# Patient Record
Sex: Female | Born: 1970
Health system: Southern US, Community
[De-identification: ages and names within clinical notes are randomized; demographics above are authoritative.]

## PROBLEM LIST (undated history)

## (undated) ENCOUNTER — Ambulatory Visit (HOSPITAL_COMMUNITY): Admission: EM | Payer: Federal, State, Local not specified - PPO | Source: Home / Self Care

---

## 2002-02-14 ENCOUNTER — Inpatient Hospital Stay (HOSPITAL_COMMUNITY): Admission: AD | Admit: 2002-02-14 | Discharge: 2002-02-14 | Payer: Self-pay | Admitting: *Deleted

## 2002-02-18 ENCOUNTER — Inpatient Hospital Stay (HOSPITAL_COMMUNITY): Admission: AD | Admit: 2002-02-18 | Discharge: 2002-02-20 | Payer: Self-pay | Admitting: Obstetrics

## 2003-02-09 ENCOUNTER — Ambulatory Visit (HOSPITAL_COMMUNITY): Admission: RE | Admit: 2003-02-09 | Discharge: 2003-02-09 | Payer: Self-pay | Admitting: *Deleted

## 2003-02-25 ENCOUNTER — Ambulatory Visit (HOSPITAL_COMMUNITY): Admission: RE | Admit: 2003-02-25 | Discharge: 2003-02-25 | Payer: Self-pay | Admitting: *Deleted

## 2003-03-06 ENCOUNTER — Ambulatory Visit (HOSPITAL_COMMUNITY): Admission: RE | Admit: 2003-03-06 | Discharge: 2003-03-06 | Payer: Self-pay | Admitting: *Deleted

## 2003-12-23 ENCOUNTER — Inpatient Hospital Stay (HOSPITAL_COMMUNITY): Admission: AD | Admit: 2003-12-23 | Discharge: 2003-12-24 | Payer: Self-pay | Admitting: Obstetrics

## 2004-07-09 ENCOUNTER — Inpatient Hospital Stay (HOSPITAL_COMMUNITY): Admission: AD | Admit: 2004-07-09 | Discharge: 2004-07-09 | Payer: Self-pay | Admitting: Obstetrics

## 2004-07-10 ENCOUNTER — Inpatient Hospital Stay (HOSPITAL_COMMUNITY): Admission: AD | Admit: 2004-07-10 | Discharge: 2004-07-12 | Payer: Self-pay | Admitting: Obstetrics

## 2009-02-13 ENCOUNTER — Ambulatory Visit: Payer: Self-pay | Admitting: Physician Assistant

## 2009-02-13 ENCOUNTER — Inpatient Hospital Stay (HOSPITAL_COMMUNITY): Admission: AD | Admit: 2009-02-13 | Discharge: 2009-02-13 | Payer: Self-pay | Admitting: Obstetrics

## 2009-03-05 ENCOUNTER — Ambulatory Visit (HOSPITAL_COMMUNITY): Admission: RE | Admit: 2009-03-05 | Discharge: 2009-03-05 | Payer: Self-pay | Admitting: Obstetrics

## 2009-05-27 ENCOUNTER — Ambulatory Visit (HOSPITAL_COMMUNITY): Admission: RE | Admit: 2009-05-27 | Discharge: 2009-05-27 | Payer: Self-pay | Admitting: Obstetrics

## 2009-07-16 ENCOUNTER — Inpatient Hospital Stay (HOSPITAL_COMMUNITY): Admission: AD | Admit: 2009-07-16 | Discharge: 2009-07-16 | Payer: Self-pay | Admitting: Obstetrics

## 2009-07-28 ENCOUNTER — Encounter (INDEPENDENT_AMBULATORY_CARE_PROVIDER_SITE_OTHER): Payer: Self-pay | Admitting: Obstetrics

## 2009-07-28 ENCOUNTER — Inpatient Hospital Stay (HOSPITAL_COMMUNITY): Admission: RE | Admit: 2009-07-28 | Discharge: 2009-07-30 | Payer: Self-pay | Admitting: Obstetrics

## 2009-07-31 ENCOUNTER — Inpatient Hospital Stay (HOSPITAL_COMMUNITY): Admission: AD | Admit: 2009-07-31 | Discharge: 2009-07-31 | Payer: Self-pay | Admitting: Obstetrics

## 2010-04-17 ENCOUNTER — Encounter: Payer: Self-pay | Admitting: Obstetrics

## 2010-06-14 LAB — CBC
HCT: 25.9 % — ABNORMAL LOW (ref 36.0–46.0)
HCT: 40.7 % (ref 36.0–46.0)
Hemoglobin: 14 g/dL (ref 12.0–15.0)
Hemoglobin: 8.9 g/dL — ABNORMAL LOW (ref 12.0–15.0)
MCHC: 34.3 g/dL (ref 30.0–36.0)
MCHC: 34.6 g/dL (ref 30.0–36.0)
MCV: 86.4 fL (ref 78.0–100.0)
MCV: 87.5 fL (ref 78.0–100.0)
Platelets: 101 10*3/uL — ABNORMAL LOW (ref 150–400)
Platelets: 122 10*3/uL — ABNORMAL LOW (ref 150–400)
RBC: 2.96 MIL/uL — ABNORMAL LOW (ref 3.87–5.11)
RBC: 4.71 MIL/uL (ref 3.87–5.11)
RDW: 19.1 % — ABNORMAL HIGH (ref 11.5–15.5)
RDW: 19.5 % — ABNORMAL HIGH (ref 11.5–15.5)
WBC: 30.5 10*3/uL — ABNORMAL HIGH (ref 4.0–10.5)
WBC: 6 10*3/uL (ref 4.0–10.5)

## 2010-06-14 LAB — COMPREHENSIVE METABOLIC PANEL
ALT: 15 U/L (ref 0–35)
AST: 28 U/L (ref 0–37)
Albumin: 2.8 g/dL — ABNORMAL LOW (ref 3.5–5.2)
Alkaline Phosphatase: 248 U/L — ABNORMAL HIGH (ref 39–117)
BUN: 4 mg/dL — ABNORMAL LOW (ref 6–23)
CO2: 21 mEq/L (ref 19–32)
Calcium: 8.6 mg/dL (ref 8.4–10.5)
Chloride: 106 mEq/L (ref 96–112)
Creatinine, Ser: 0.74 mg/dL (ref 0.4–1.2)
GFR calc Af Amer: >60 mL/min (ref >60)
GFR calc non Af Amer: >60 mL/min (ref >60)
Glucose, Bld: 70 mg/dL (ref 70–99)
Potassium: 3.6 mEq/L (ref 3.5–5.1)
Sodium: 136 mEq/L (ref 135–145)
Total Bilirubin: 0.5 mg/dL (ref 0.3–1.2)
Total Protein: 6.3 g/dL (ref 6.0–8.3)

## 2010-06-14 LAB — STREP B DNA PROBE: Strep Group B Ag: NEGATIVE

## 2010-06-14 LAB — RPR: RPR Ser Ql: NONREACTIVE

## 2010-06-29 LAB — URINALYSIS, ROUTINE W REFLEX MICROSCOPIC
Bilirubin Urine: NEGATIVE
Glucose, UA: NEGATIVE mg/dL
Hgb urine dipstick: NEGATIVE
Ketones, ur: NEGATIVE mg/dL
Nitrite: NEGATIVE
Protein, ur: NEGATIVE mg/dL
Specific Gravity, Urine: 1.015 (ref 1.005–1.030)
Urobilinogen, UA: 0.2 mg/dL (ref 0.0–1.0)
pH: 7.5 (ref 5.0–8.0)

## 2010-08-12 NOTE — Op Note (Signed)
NAMEMALAIYA, PACZKOWSKI                           ACCOUNT NO.:  [id number]   MEDICAL RECORD NO.:  [id number]                   PATIENT TYPE:  AMB   LOCATION:  ENDO                                 FACILITY:  [facility]   PHYSICIAN:  Georgiana Spinner, M.D.                 DATE OF BIRTH:  05-Jun-1970   DATE OF PROCEDURE:  02/25/2003  DATE OF DISCHARGE:                                 OPERATIVE REPORT   PROCEDURE:  Upper endoscopy.   INDICATIONS:  Abdominal pain.   ANESTHESIA:  Demerol 60 mg, Versed 6 mg.   DESCRIPTION OF PROCEDURE:  With the patient mildly sedated in the left  lateral decubitus position, the Olympus videoscopic endoscope was inserted  in the mouth, passed under direct vision through the esophagus, which  appeared normal, into the stomach.  The fundus, body, antrum, duodenal bulb,  second portion of the duodenum all appeared normal.  From this point the  endoscope was slowly withdrawn, taking circumferential views of the duodenal  mucosa until the endoscope had been pulled back into the stomach, placed in  retroflexion to view the stomach from below.  The endoscope was straightened  and withdrawn taking circumferential views of the remaining gastric and  esophageal mucosa.  The patient's vital signs and pulse oximetry remained  stable.  The patient tolerated the procedure well and without apparent  complications.   FINDINGS:  Unremarkable examination.   PLAN:  Will discuss with husband and have the patient follow up with me as  an outpatient to review scans that have been done and/or ordered.                                               Georgiana Spinner, M.D.    GMO/MEDQ  D:  02/25/2003  T:  02/25/2003  Job:  [id number]   cc:   Dr. Lance Bosch, [facility]

## 2010-10-12 ENCOUNTER — Other Ambulatory Visit (HOSPITAL_COMMUNITY): Payer: Self-pay | Admitting: Obstetrics

## 2010-10-12 DIAGNOSIS — Z1231 Encounter for screening mammogram for malignant neoplasm of breast: Secondary | ICD-10-CM

## 2010-10-21 ENCOUNTER — Ambulatory Visit (HOSPITAL_COMMUNITY)
Admission: RE | Admit: 2010-10-21 | Discharge: 2010-10-21 | Disposition: A | Discharge status: Home / Self Care | Payer: Federal, State, Local not specified - PPO | Source: Ambulatory Visit | Attending: Obstetrics | Admitting: Obstetrics

## 2010-10-21 DIAGNOSIS — Z1231 Encounter for screening mammogram for malignant neoplasm of breast: Secondary | ICD-10-CM | POA: Insufficient documentation

## 2011-05-04 ENCOUNTER — Encounter (HOSPITAL_COMMUNITY): Payer: Self-pay | Admitting: Emergency Medicine

## 2011-05-04 ENCOUNTER — Emergency Department (HOSPITAL_COMMUNITY)
Admission: EM | Admit: 2011-05-04 | Discharge: 2011-05-05 | Disposition: A | Discharge status: Home / Self Care | Payer: Federal, State, Local not specified - PPO | Attending: Emergency Medicine | Admitting: Emergency Medicine

## 2011-05-04 ENCOUNTER — Emergency Department (HOSPITAL_COMMUNITY)
Admission: EM | Admit: 2011-05-04 | Discharge: 2011-05-04 | Disposition: A | Discharge status: Home / Self Care | Payer: Federal, State, Local not specified - PPO | Attending: Emergency Medicine | Admitting: Emergency Medicine

## 2011-05-04 DIAGNOSIS — K0889 Other specified disorders of teeth and supporting structures: Secondary | ICD-10-CM

## 2011-05-04 DIAGNOSIS — K089 Disorder of teeth and supporting structures, unspecified: Secondary | ICD-10-CM | POA: Insufficient documentation

## 2011-05-04 DIAGNOSIS — K029 Dental caries, unspecified: Secondary | ICD-10-CM | POA: Insufficient documentation

## 2011-05-04 DIAGNOSIS — Z79899 Other long term (current) drug therapy: Secondary | ICD-10-CM | POA: Insufficient documentation

## 2011-05-04 MED ORDER — OXYCODONE-ACETAMINOPHEN 5-325 MG PO TABS: 1.0000 | ORAL_TABLET | ORAL | Status: AC | PRN

## 2011-05-04 MED ORDER — BUPIVACAINE HCL (PF) 0.5 % IJ SOLN
10.0000 mL | Freq: Once | INTRAMUSCULAR | Status: AC
  Administered 2011-05-05: 10 mL
  Filled 2011-05-04: qty 30

## 2011-05-04 MED ORDER — HYDROMORPHONE HCL PF 1 MG/ML IJ SOLN
1.0000 mg | Freq: Once | INTRAMUSCULAR | Status: AC
Start: 2011-05-04 — End: 2011-05-04
  Administered 2011-05-04: 1 mg via INTRAMUSCULAR
  Filled 2011-05-04: qty 1

## 2011-05-04 MED ORDER — HYDROMORPHONE HCL PF 1 MG/ML IJ SOLN
1.0000 mg | Freq: Once | INTRAMUSCULAR | Status: AC
  Administered 2011-05-04: 1 mg via INTRAMUSCULAR
  Filled 2011-05-04: qty 1

## 2011-05-04 NOTE — ED Notes (Signed)
Pt alert, c/o dental pain, seen in ED yesterday, states no relief, has f/u tomorrow

## 2011-05-04 NOTE — ED Provider Notes (Signed)
History     CSN: [id number]  Arrival date & time 05/04/11  2129   None     Chief Complaint  Patient presents with  . Dental Pain    (Consider location/radiation/quality/duration/timing/severity/associated sxs/prior treatment) HPI  Patient presents to emergency department complaining of left upper dental pain. Patient had dental extraction by her dentist yesterday and presented to the emergency department at 0300 this morning for evaluation of "severe dental pain." Patient was given narcotics in the ER with relief of pain and sent home with prescription of pain medication to followup with her dentist tomorrow morning at 10:30 AM. Per the patient and her husband, the patient has been taking at home by mouth narcotics without relief of pain. He denies fevers, chills, difficulty swallowing, difficulty breathing, facial swelling, earache, or headache. Patient states ongoing constant pain at site of tooth extraction. Patient states pain is aggravated by chewing and cold air. Denies alleviating factors.  History reviewed. No pertinent past medical history.  History reviewed. No pertinent past surgical history.  No family history on file.  History  Substance Use Topics  . Smoking status: Never Smoker   . Smokeless tobacco: Not on file  . Alcohol Use: No    OB History    Grav Para Term Preterm Abortions TAB SAB Ect Mult Living                  Review of Systems  All other systems reviewed and are negative.    Allergies  Review of patient's allergies indicates no known allergies.  Home Medications   Current Outpatient Rx  Name Route Sig Dispense Refill  . AMOXICILLIN 500 MG PO CAPS Oral Take 500 mg by mouth 3 (three) times daily.    Marland Kitchen HYDROCODONE-ACETAMINOPHEN 7.5-325 MG PO TABS Oral Take 1 tablet by mouth every 6 (six) hours as needed. Dose is 7.5-500mg     . OXYCODONE-ACETAMINOPHEN 5-325 MG PO TABS Oral Take 1 tablet by mouth every 4 (four) hours as needed for pain. 30  tablet 0    BP 129/78  Pulse 98  Temp(Src) 98.5 F (36.9 C) (Oral)  Resp 16  Wt 150 lb (68.04 kg)  SpO2 97%  Physical Exam  Vitals reviewed. Constitutional: She is oriented to person, place, and time. She appears well-developed and well-nourished. No distress.  HENT:  Head: Normocephalic and atraumatic.  Right Ear: External ear normal.  Left Ear: External ear normal.  Nose: Nose normal.  Mouth/Throat: No oropharyngeal exudate.       Massive decay of left upper first molar with dental extraction of second bicuspid. No gingival fluctuance of swelling. Patent airway.   Eyes: Conjunctivae and EOM are normal. Pupils are equal, round, and reactive to light.  Neck: Normal range of motion. Neck supple.  Cardiovascular: Normal rate, regular rhythm and normal heart sounds.  Exam reveals no gallop and no friction rub.   No murmur heard. Pulmonary/Chest: Effort normal and breath sounds normal. No respiratory distress. She has no wheezes. She has no rales. She exhibits no tenderness.  Abdominal: Soft. She exhibits no distension and no mass. There is no tenderness. There is no rebound and no guarding.  Lymphadenopathy:    She has no cervical adenopathy.  Neurological: She is alert and oriented to person, place, and time. She has normal reflexes.  Skin: Skin is warm and dry. No rash noted. She is not diaphoretic.  Psychiatric: She has a normal mood and affect.    ED Course  Dental Date/Time:  05/05/2011 12:11 AM Performed by: Jenness Corner Authorized by: Jenness Corner Consent: Verbal consent obtained. Consent given by: patient Patient understanding: patient states understanding of the procedure being performed Patient consent: the patient's understanding of the procedure matches consent given Patient identity confirmed: verbally with patient Local anesthesia used: yes Anesthesia: nerve block Local anesthetic: bupivacaine 0.5% without epinephrine Anesthetic total: 5 ml Patient  tolerance: Patient tolerated the procedure well with no immediate complications.    IM dilaudid.   Labs Reviewed - No data to display No results found.   No diagnosis found.    MDM  Afebrile, non toxic-appearing with a patent airway and no signs or symptoms of dental abscess. Mild gingival erythema but no fluctuant mass. Patient has close followup with dentist tomorrow morning at 10:30. No facial swelling.        Jenness Corner, [place] 05/05/11 0013  Medical screening examination/treatment/procedure(s) were performed by non-physician practitioner and as supervising physician I was immediately available for consultation/collaboration.  Sunnie Nielsen, MD 05/05/11 [phone]

## 2011-05-04 NOTE — ED Provider Notes (Signed)
Medical screening examination/treatment/procedure(s) were performed by non-physician practitioner and as supervising physician I was immediately available for consultation/collaboration.   Raeford Razor, MD 05/04/11 [id number]

## 2011-05-04 NOTE — ED Provider Notes (Signed)
History     CSN: [id number]  Arrival date & time 05/04/11  0331   First MD Initiated Contact with Patient 05/04/11 [phone]      Chief Complaint  Patient presents with  . Dental Pain    (Consider location/radiation/quality/duration/timing/severity/associated sxs/prior treatment) HPI Comments: Patient here with husband, she reports that she had a tooth pulled yesterday at 1500 - states initially the pain medication she was given was helping but reports now is not - denies drainage from the gum, no fever or chills.  Patient is a 41 y.o. female presenting with tooth pain. The history is provided by the patient and the spouse. No language interpreter was used.  Dental PainThe primary symptoms include mouth pain. Primary symptoms do not include dental injury, oral bleeding, oral lesions, headaches, fever, shortness of breath, sore throat, angioedema or cough. The symptoms began 2 to 6 hours ago. The symptoms are worsening. The symptoms are new. The symptoms occur constantly.  Additional symptoms include: dental sensitivity to temperature, gum tenderness, jaw pain and ear pain. Additional symptoms do not include: gum swelling, purulent gums, trismus, facial swelling, trouble swallowing, pain with swallowing, excessive salivation, dry mouth, taste disturbance, smell disturbance, drooling, hearing loss, nosebleeds, swollen glands, goiter and fatigue.    History reviewed. No pertinent past medical history.  History reviewed. No pertinent past surgical history.  History reviewed. No pertinent family history.  History  Substance Use Topics  . Smoking status: Never Smoker   . Smokeless tobacco: Not on file  . Alcohol Use: No    OB History    Grav Para Term Preterm Abortions TAB SAB Ect Mult Living                  Review of Systems  Constitutional: Negative for fever and fatigue.  HENT: Positive for ear pain. Negative for hearing loss, nosebleeds, sore throat, facial swelling, drooling and  trouble swallowing.   Respiratory: Negative for cough and shortness of breath.   Neurological: Negative for headaches.  All other systems reviewed and are negative.    Allergies  Review of patient's allergies indicates no known allergies.  Home Medications   Current Outpatient Rx  Name Route Sig Dispense Refill  . AMOXICILLIN 500 MG PO CAPS Oral Take 500 mg by mouth 3 (three) times daily.    Marland Kitchen HYDROCODONE-ACETAMINOPHEN 7.5-325 MG PO TABS Oral Take 1 tablet by mouth every 6 (six) hours as needed. Dose is 7.5-500mg       BP 126/54  Pulse 96  Temp(Src) 98.3 F (36.8 C) (Oral)  Resp 16  SpO2 100%  Physical Exam  Nursing note and vitals reviewed. Constitutional: She is oriented to person, place, and time. She appears well-developed and well-nourished. No distress.  HENT:  Head: Normocephalic and atraumatic.  Right Ear: External ear normal.  Left Ear: External ear normal.  Nose: Nose normal.  Mouth/Throat: Oropharynx is clear and moist. Abnormal dentition. No dental abscesses. No oropharyngeal exudate.    Eyes: Conjunctivae are normal. Pupils are equal, round, and reactive to light. No scleral icterus.  Neck: Normal range of motion. Neck supple.  Cardiovascular: Normal rate, regular rhythm and normal heart sounds.  Exam reveals no gallop and no friction rub.   No murmur heard. Pulmonary/Chest: Effort normal and breath sounds normal. She exhibits no tenderness.  Abdominal: Soft. Bowel sounds are normal. She exhibits no distension. There is no tenderness.  Musculoskeletal: Normal range of motion. She exhibits no edema and no tenderness.  Lymphadenopathy:  She has no cervical adenopathy.  Neurological: She is alert and oriented to person, place, and time. No cranial nerve deficit.  Skin: Skin is warm and dry. No rash noted. No erythema. No pallor.  Psychiatric: She has a normal mood and affect. Her behavior is normal. Judgment and thought content normal.    ED Course    Procedures (including critical care time)  Labs Reviewed - No data to display No results found.   Dental pain s/p tooth extraction    MDM  No evidence for infection - will give better pain control - patient to follow up with her dentist tomorrow.      Feels better after pain control - will discharge home.  Izola Price [place], [place] 05/04/11 [phone]

## 2011-05-04 NOTE — ED Notes (Addendum)
Pt had a tooth pulled yesterday around 3pm and is c/o pain at the site  Pt was given antibiotic and pain medication by her dentist

## 2011-05-04 NOTE — ED Notes (Signed)
Pt provided ice pack 

## 2011-09-19 ENCOUNTER — Other Ambulatory Visit (HOSPITAL_COMMUNITY): Payer: Self-pay | Admitting: Obstetrics

## 2011-09-19 DIAGNOSIS — Z1231 Encounter for screening mammogram for malignant neoplasm of breast: Secondary | ICD-10-CM

## 2011-10-04 ENCOUNTER — Ambulatory Visit (INDEPENDENT_AMBULATORY_CARE_PROVIDER_SITE_OTHER): Payer: Federal, State, Local not specified - PPO | Admitting: Family Medicine

## 2011-10-04 ENCOUNTER — Ambulatory Visit: Payer: Federal, State, Local not specified - PPO

## 2011-10-04 VITALS — BP 98/70 | HR 91 | Temp 98.1°F | Resp 18 | Ht 64.5 in | Wt 142.0 lb

## 2011-10-04 DIAGNOSIS — R109 Unspecified abdominal pain: Secondary | ICD-10-CM

## 2011-10-04 DIAGNOSIS — R110 Nausea: Secondary | ICD-10-CM

## 2011-10-04 LAB — POCT UA - MICROSCOPIC ONLY
Bacteria, U Microscopic: NEGATIVE
Casts, Ur, LPF, POC: NEGATIVE
Crystals, Ur, HPF, POC: NEGATIVE
Yeast, UA: NEGATIVE

## 2011-10-04 LAB — POCT CBC
Granulocyte percent: 65.9 %G (ref 37–80)
HCT, POC: 47.2 % (ref 37.7–47.9)
Hemoglobin: 15.3 g/dL (ref 12.2–16.2)
Lymph, poc: 2.6 (ref 0.6–3.4)
MCH, POC: 29.9 pg (ref 27–31.2)
MCHC: 32.4 g/dL (ref 31.8–35.4)
MCV: 92.2 fL (ref 80–97)
MID (cbc): 0.8 (ref 0–0.9)
MPV: 9.9 fL (ref 0–99.8)
POC Granulocyte: 6.7 (ref 2–6.9)
POC LYMPH PERCENT: 25.8 %L (ref 10–50)
POC MID %: 8.3 %M (ref 0–12)
Platelet Count, POC: 286 10*3/uL (ref 142–424)
RBC: 5.12 M/uL (ref 4.04–5.48)
RDW, POC: 12.6 %
WBC: 10.2 10*3/uL (ref 4.6–10.2)

## 2011-10-04 LAB — POCT URINE PREGNANCY: Preg Test, Ur: NEGATIVE

## 2011-10-04 MED ORDER — ONDANSETRON 8 MG PO TBDP: 8.0000 mg | ORAL_TABLET | Freq: Three times a day (TID) | ORAL | Status: AC | PRN

## 2011-10-04 MED ORDER — DICYCLOMINE HCL 10 MG PO CAPS: 10.0000 mg | ORAL_CAPSULE | Freq: Three times a day (TID) | ORAL | Status: DC

## 2011-10-04 MED ORDER — ONDANSETRON 4 MG PO TBDP
4.0000 mg | ORAL_TABLET | Freq: Once | ORAL | Status: AC
  Administered 2011-10-04: 4 mg via ORAL

## 2011-10-04 NOTE — Progress Notes (Signed)
[name] 41 year old Arabic woman who comes in with her husband. Woman developed nausea vomiting and crampy bowel pain at 7 AM this morning with a rather acute onset. The cramps persisted all day with 3 episodes of vomiting. She's also had some diarrhea but is been no blood in the stool.  Patient has no history of abdominal problems. She denies any urinary symptoms, fever, hematemesis.  Objective: Patient is alert but is moaning and holding her abdomen HEENT: Unremarkable Heart: Regular no murmur: Chest: Clear Abdomen: Soft diffusely tender with deep palpation, no rebound no guarding, no HSM or masses  [facility] reading (PRIMARY) by  Dr. Milus Glazier:  Neg KUB Results for orders placed in visit on 10/04/11  POCT UA - MICROSCOPIC ONLY      Component Value Range   WBC, Ur, HPF, POC 0-1     RBC, urine, microscopic 0-1     Bacteria, U Microscopic neg     Mucus, UA small     Epithelial cells, urine per micros 1-5     Crystals, Ur, HPF, POC neg     Casts, Ur, LPF, POC neg     Yeast, UA neg    POCT URINE PREGNANCY      Component Value Range   Preg Test, Ur Negative    POCT CBC      Component Value Range   WBC 10.2  4.6 - 10.2 K/uL   Lymph, poc 2.6  0.6 - 3.4   POC LYMPH PERCENT 25.8  10 - 50 %L   MID (cbc) 0.8  0 - 0.9   POC MID % 8.3  0 - 12 %M   POC Granulocyte 6.7  2 - 6.9   Granulocyte percent 65.9  37 - 80 %G   RBC 5.12  4.04 - 5.48 M/uL   Hemoglobin 15.3  12.2 - 16.2 g/dL   HCT, POC 69.6  29.5 - 47.9 %   MCV 92.2  80 - 97 fL   MCH, POC 29.9  27 - 31.2 pg   MCHC 32.4  31.8 - 35.4 g/dL   RDW, POC 28.4     Platelet Count, POC 286  142 - 424 K/uL   MPV 9.9  0 - 99.8 fL   A:  Acute gastroenteritis  P:  Bentyl Zofran.  1. Abdominal pain  DG Abd 1 View, POCT UA - Microscopic Only, POCT urine pregnancy, POCT CBC, ondansetron (ZOFRAN ODT) 8 MG disintegrating tablet, dicyclomine (BENTYL) 10 MG capsule

## 2011-10-23 ENCOUNTER — Ambulatory Visit (HOSPITAL_COMMUNITY): Payer: Federal, State, Local not specified - PPO | Attending: Obstetrics

## 2012-09-16 ENCOUNTER — Ambulatory Visit (INDEPENDENT_AMBULATORY_CARE_PROVIDER_SITE_OTHER): Payer: Federal, State, Local not specified - PPO | Admitting: Family Medicine

## 2012-09-16 ENCOUNTER — Ambulatory Visit: Payer: Federal, State, Local not specified - PPO

## 2012-09-16 VITALS — BP 124/72 | HR 101 | Temp 97.5°F | Resp 20 | Ht 65.5 in | Wt 147.0 lb

## 2012-09-16 DIAGNOSIS — R51 Headache: Secondary | ICD-10-CM

## 2012-09-16 DIAGNOSIS — R059 Cough, unspecified: Secondary | ICD-10-CM

## 2012-09-16 DIAGNOSIS — J029 Acute pharyngitis, unspecified: Secondary | ICD-10-CM

## 2012-09-16 DIAGNOSIS — R05 Cough: Secondary | ICD-10-CM

## 2012-09-16 DIAGNOSIS — J45901 Unspecified asthma with (acute) exacerbation: Secondary | ICD-10-CM

## 2012-09-16 LAB — POCT CBC
HCT, POC: 44 % (ref 37.7–47.9)
Hemoglobin: 14.4 g/dL (ref 12.2–16.2)
Lymph, poc: 2.3 (ref 0.6–3.4)
MCHC: 32.7 g/dL (ref 31.8–35.4)
MCV: 92 fL (ref 80–97)
POC Granulocyte: 4.5 (ref 2–6.9)
POC LYMPH PERCENT: 30.1 %L (ref 10–50)

## 2012-09-16 MED ORDER — HYDROCODONE-HOMATROPINE 5-1.5 MG/5ML PO SYRP: 5.0000 mL | ORAL_SOLUTION | ORAL | Status: DC | PRN

## 2012-09-16 MED ORDER — ALBUTEROL SULFATE (2.5 MG/3ML) 0.083% IN NEBU: 2.5000 mg | INHALATION_SOLUTION | Freq: Once | RESPIRATORY_TRACT | Status: DC

## 2012-09-16 MED ORDER — ALBUTEROL SULFATE HFA 108 (90 BASE) MCG/ACT IN AERS: 2.0000 | INHALATION_SPRAY | Freq: Four times a day (QID) | RESPIRATORY_TRACT | Status: DC | PRN

## 2012-09-16 MED ORDER — TRAMADOL HCL 50 MG PO TABS: 50.0000 mg | ORAL_TABLET | Freq: Four times a day (QID) | ORAL | Status: DC | PRN

## 2012-09-16 MED ORDER — BENZONATATE 100 MG PO CAPS: ORAL_CAPSULE | ORAL | Status: DC

## 2012-09-16 NOTE — Patient Instructions (Signed)
Drink lots of water and other liquids  Try to get enough rest  Take the cough syrup 1 teaspoon every 4-6 hours as needed for cough. It does make you sleepy, so many people prefer to take it at bedtime. However you can take it in the daytime if you wish.  Take the cough pills 3 times daily. These do not cause as much sedation, and you can probably take them in the daytime without making her feel too sleepy.  Use the inhaler 2 puffs every 4-6 hours when awake as directed.  Use the headache pills when needed for bad headache.  Return if worse.  Acute Bronchitis You have acute bronchitis. This means you have a chest cold. The airways in your lungs are red and sore (inflamed). Acute means it is sudden onset.  CAUSES Bronchitis is most often caused by the same virus that causes a cold. SYMPTOMS   Body aches.  Chest congestion.  Chills.  Cough.  Fever.  Shortness of breath.  Sore throat. TREATMENT  Acute bronchitis is usually treated with rest, fluids, and medicines for relief of fever or cough. Most symptoms should go away after a few days or a week. Increased fluids may help thin your secretions and will prevent dehydration. Your caregiver may give you an inhaler to improve your symptoms. The inhaler reduces shortness of breath and helps control cough. You can take over-the-counter pain relievers or cough medicine to decrease coughing, pain, or fever. A cool-air vaporizer may help thin bronchial secretions and make it easier to clear your chest. Antibiotics are usually not needed but can be prescribed if you smoke, are seriously ill, have chronic lung problems, are elderly, or you are at higher risk for developing complications.Allergies and asthma can make bronchitis worse. Repeated episodes of bronchitis may cause longstanding lung problems. Avoid smoking and secondhand smoke.Exposure to cigarette smoke or irritating chemicals will make bronchitis worse. If you are a cigarette  smoker, consider using nicotine gum or skin patches to help control withdrawal symptoms. Quitting smoking will help your lungs heal faster. Recovery from bronchitis is often slow, but you should start feeling better after 2 to 3 days. Cough from bronchitis frequently lasts for 3 to 4 weeks. To prevent another bout of acute bronchitis:  Quit smoking.  Wash your hands frequently to get rid of viruses or use a hand sanitizer.  Avoid other people with cold or virus symptoms.  Try not to touch your hands to your mouth, nose, or eyes. SEEK IMMEDIATE MEDICAL CARE IF:  You develop increased fever, chills, or chest pain.  You have severe shortness of breath or bloody sputum.  You develop dehydration, fainting, repeated vomiting, or a severe headache.  You have no improvement after 1 week of treatment or you get worse. MAKE SURE YOU:   Understand these instructions.  Will watch your condition.  Will get help right away if you are not doing well or get worse. Document Released: 04/20/2004 Document Revised: 06/05/2011 Document Reviewed: 07/06/2010 [facility] Patient Information 2014 [place], [place].

## 2012-09-16 NOTE — Progress Notes (Signed)
42 year old lady with a cough since Saturday. She has not been febrile. She does have a bad sore throat. Does not smoke. She does not work outside the home. They have 4 children but children not ill. She has a bad headache along with this. She has photophobia.  Objective: Constantly coughing and short of breath. TMs normal. Throat has some lymphoid hyperplasia but not erythematous. Her neck was supple without significant nodes. Chest has a few rhonchi on the right. Very shallow respirations. Heart regular without murmurs. Constant cough.  Assessment: Bronchitis Headache Sore throat  Plan: Nebulizer treatment. Then get a chest x-ray. Also do a CBC on her.  [facility] reading (PRIMARY) by  Dr. Alwyn Ren Normal chest   Results for orders placed in visit on 09/16/12  POCT CBC      Result Value Range   WBC 7.6  4.6 - 10.2 K/uL   Lymph, poc 2.3  0.6 - 3.4   POC LYMPH PERCENT 30.1  10 - 50 %L   MID (cbc) 0.8  0 - 0.9   POC MID % 10.5  0 - 12 %M   POC Granulocyte 4.5  2 - 6.9   Granulocyte percent 59.4  37 - 80 %G   RBC 4.78  4.04 - 5.48 M/uL   Hemoglobin 14.4  12.2 - 16.2 g/dL   HCT, POC 16.1  09.6 - 47.9 %   MCV 92.0  80 - 97 fL   MCH, POC 30.1  27 - 31.2 pg   MCHC 32.7  31.8 - 35.4 g/dL   RDW, POC 04.5     Platelet Count, POC 245  142 - 424 K/uL   MPV 9.4  0 - 99.8 fL   . She is not coughing nearly as much after the nebulizer treatment. Her lungs sound fairly clear now.  Assessment: This is a viral bronchitis, asthmatic type. It should run its course in the next for 5 days, but hopefully we can control the symptoms until then treating symptomatically.

## 2013-08-02 ENCOUNTER — Ambulatory Visit (INDEPENDENT_AMBULATORY_CARE_PROVIDER_SITE_OTHER): Payer: Federal, State, Local not specified - PPO | Admitting: Family Medicine

## 2013-08-02 VITALS — BP 122/62 | HR 96 | Temp 98.6°F | Resp 18 | Ht 64.0 in | Wt 148.5 lb

## 2013-08-02 DIAGNOSIS — J020 Streptococcal pharyngitis: Secondary | ICD-10-CM

## 2013-08-02 DIAGNOSIS — J029 Acute pharyngitis, unspecified: Secondary | ICD-10-CM

## 2013-08-02 DIAGNOSIS — B9789 Other viral agents as the cause of diseases classified elsewhere: Secondary | ICD-10-CM

## 2013-08-02 DIAGNOSIS — J028 Acute pharyngitis due to other specified organisms: Secondary | ICD-10-CM

## 2013-08-02 DIAGNOSIS — R109 Unspecified abdominal pain: Secondary | ICD-10-CM

## 2013-08-02 LAB — POCT URINALYSIS DIPSTICK
BILIRUBIN UA: NEGATIVE
GLUCOSE UA: NEGATIVE
KETONES UA: NEGATIVE
NITRITE UA: NEGATIVE
PH UA: 5.5
Protein, UA: NEGATIVE
Spec Grav, UA: <=1.005
Urobilinogen, UA: 0.2

## 2013-08-02 LAB — POCT CBC
Granulocyte percent: 67.4 %G (ref 37–80)
HCT, POC: 39.6 % (ref 37.7–47.9)
Hemoglobin: 13 g/dL (ref 12.2–16.2)
Lymph, poc: 1.7 (ref 0.6–3.4)
MCH, POC: 29.5 pg (ref 27–31.2)
MCHC: 32.8 g/dL (ref 31.8–35.4)
MCV: 89.7 fL (ref 80–97)
MID (cbc): 0.8 (ref 0–0.9)
MPV: 9.7 fL (ref 0–99.8)
POC GRANULOCYTE: 5 (ref 2–6.9)
POC LYMPH PERCENT: 22.4 %L (ref 10–50)
POC MID %: 10.2 %M (ref 0–12)
Platelet Count, POC: 190 10*3/uL (ref 142–424)
RBC: 4.41 M/uL (ref 4.04–5.48)
RDW, POC: 13 %
WBC: 7.4 10*3/uL (ref 4.6–10.2)

## 2013-08-02 LAB — COMPREHENSIVE METABOLIC PANEL
ALBUMIN: 4 g/dL (ref 3.5–5.2)
ALK PHOS: 50 U/L (ref 39–117)
ALT: 13 U/L (ref 0–35)
AST: 15 U/L (ref 0–37)
BUN: 6 mg/dL (ref 6–23)
CALCIUM: 9.1 mg/dL (ref 8.4–10.5)
CHLORIDE: 100 meq/L (ref 96–112)
CO2: 25 mEq/L (ref 19–32)
Creat: 0.66 mg/dL (ref 0.50–1.10)
Glucose, Bld: 96 mg/dL (ref 70–99)
POTASSIUM: 3.7 meq/L (ref 3.5–5.3)
SODIUM: 134 meq/L — AB (ref 135–145)
TOTAL PROTEIN: 7 g/dL (ref 6.0–8.3)
Total Bilirubin: 0.4 mg/dL (ref 0.2–1.2)

## 2013-08-02 LAB — POCT UA - MICROSCOPIC ONLY
Casts, Ur, LPF, POC: NEGATIVE
Crystals, Ur, HPF, POC: NEGATIVE
Mucus, UA: NEGATIVE
Yeast, UA: NEGATIVE

## 2013-08-02 LAB — POCT URINE PREGNANCY: PREG TEST UR: NEGATIVE

## 2013-08-02 LAB — POCT RAPID STREP A (OFFICE): RAPID STREP A SCREEN: NEGATIVE

## 2013-08-02 MED ORDER — HYDROCODONE-ACETAMINOPHEN 7.5-325 MG/15ML PO SOLN: 10.0000 mL | Freq: Four times a day (QID) | ORAL | Status: DC | PRN

## 2013-08-02 MED ORDER — PROMETHAZINE HCL 25 MG PO TABS: 25.0000 mg | ORAL_TABLET | Freq: Three times a day (TID) | ORAL | Status: DC | PRN

## 2013-08-02 MED ORDER — MAGIC MOUTHWASH W/LIDOCAINE: 10.0000 mL | ORAL | Status: DC | PRN

## 2013-08-02 NOTE — Progress Notes (Signed)
Subjective:   This chart was scribed for Jennifer Sorenson, MD, by Jennifer Shannon, ED Scribe. This patient was seen  at 10:37 AM.   Patient ID: Jennifer Shannon, female    DOB: Jan 12, 1971, 43 y.o.   MRN: [id number]  Chief Complaint  Patient presents with  . Sore Throat  . Headache  . Dizziness  . Generalized Body Aches    HPI  HPI Comments: Jennifer Shannon is a 43 y.o. female who presents to the [facility] complaining of a sore throat which began yesterday. She also voices a subjective fever and chills, myalgia including neck pain and back pain, abdominal pain, nausea, and a headache which is increased with light. Her temperature is 98.6 F at the clinic. She has been drinking, but not eating normally.   The pt has used advil for her symptoms without resolution.   She denies constipation, diarrhea, hematochezia, dysuria, blurred vision, congestion, rhinorrhea, otalgia, cough, hemoptysis, chest pain, SOB, and wheezing. She denies sick contacts.   No past medical history on file.  No current outpatient prescriptions on file prior to visit.   Current Facility-Administered Medications on File Prior to Visit  Medication Dose Route Frequency Provider Last Rate Last Dose  . albuterol (PROVENTIL) (2.5 MG/3ML) 0.083% nebulizer solution 2.5 mg  2.5 mg Nebulization Once Peyton Najjar, MD        No Known Allergies   Review of Systems  Constitutional: Positive for fever (Subjective ), chills and appetite change.  HENT: Positive for sore throat. Negative for congestion, ear pain, postnasal drip and rhinorrhea.   Respiratory: Negative for cough, shortness of breath and wheezing.   Cardiovascular: Negative for chest pain.  Gastrointestinal: Positive for nausea and abdominal pain. Negative for diarrhea, constipation and blood in stool.  Musculoskeletal: Positive for back pain and myalgias.   Vitals: BP 122/62  Pulse 96  Temp(Src) 98.6 F (37 C) (Oral)  Resp 18  Ht 5\' 4"  (1.626 m)  Wt 148 lb 8 oz  (67.359 kg)  BMI 25.48 kg/m2  SpO2 100%  LMP 07/18/2013     Objective:   Physical Exam  Nursing note and vitals reviewed. Constitutional: She is oriented to person, place, and time. She appears well-developed and well-nourished. No distress.  Appears ill.   HENT:  Head: Normocephalic and atraumatic.  Right Ear: External ear normal. No swelling or tenderness. Tympanic membrane is not injected, not perforated, not erythematous, not retracted and not bulging. No middle ear effusion.  Left Ear: External ear normal. No swelling or tenderness. Tympanic membrane is not injected, not perforated, not erythematous, not retracted and not bulging.  No middle ear effusion.  Nose: Rhinorrhea present.  Mouth/Throat: Oropharyngeal exudate and posterior oropharyngeal edema present.  Nasopharynx with rhinorrhea to the left.  Tonsils 2 plus, edematous, bright beefy erythema, small amount of exudate bilaterally.   Eyes: EOM are normal.  Neck: Neck supple. No tracheal deviation present.  Cardiovascular: Normal rate, regular rhythm and normal heart sounds.   No murmur heard. Normal S1 and S2.  Pulmonary/Chest: Effort normal and breath sounds normal. No respiratory distress. She has no wheezes. She has no rales.  Lungs clear to ausculation.   Abdominal: Soft. Bowel sounds are normal. She exhibits no distension and no mass. There is tenderness. There is no rebound and no guarding.  Diffusely tender. No HSM palpable.   Musculoskeletal: Normal range of motion. She exhibits tenderness.  Mild CVA tenderness bilaterally.  Full ROM, but with pain at extremes.  Lymphadenopathy:       Head (right side): No submandibular and no tonsillar adenopathy present.       Head (left side): No submandibular and no tonsillar adenopathy present.    She has cervical adenopathy.       Right cervical: Posterior cervical adenopathy present.       Left cervical: Posterior cervical adenopathy present.       Right: No  supraclavicular adenopathy present.       Left: No supraclavicular adenopathy present.  Anterior cervical adenopathy bilaterally.  Some posterior cervical adenopathy.   Neurological: She is alert and oriented to person, place, and time.  Skin: Skin is warm and dry.  Psychiatric: She has a normal mood and affect. Her behavior is normal.    Results for orders placed in visit on 08/02/13  POCT RAPID STREP A (OFFICE)      Result Value Ref Range   Rapid Strep A Screen Negative  Negative  POCT CBC      Result Value Ref Range   WBC 7.4  4.6 - 10.2 K/uL   Lymph, poc 1.7  0.6 - 3.4   POC LYMPH PERCENT 22.4  10 - 50 %L   MID (cbc) 0.8  0 - 0.9   POC MID % 10.2  0 - 12 %M   POC Granulocyte 5.0  2 - 6.9   Granulocyte percent 67.4  37 - 80 %G   RBC 4.41  4.04 - 5.48 M/uL   Hemoglobin 13.0  12.2 - 16.2 g/dL   HCT, POC 16.139.6  09.637.7 - 47.9 %   MCV 89.7  80 - 97 fL   MCH, POC 29.5  27 - 31.2 pg   MCHC 32.8  31.8 - 35.4 g/dL   RDW, POC 04.513.0     Platelet Count, POC 190  142 - 424 K/uL   MPV 9.7  0 - 99.8 fL  POCT UA - MICROSCOPIC ONLY      Result Value Ref Range   WBC, Ur, HPF, POC 2-3     RBC, urine, microscopic 0-4     Bacteria, U Microscopic trace     Mucus, UA neg     Epithelial cells, urine per micros 0-4     Crystals, Ur, HPF, POC neg     Casts, Ur, LPF, POC neg     Yeast, UA neg    POCT URINALYSIS DIPSTICK      Result Value Ref Range   Color, UA yellow     Clarity, UA clear     Glucose, UA neg     Bilirubin, UA neg     Ketones, UA neg     Spec Grav, UA <=1.005     Blood, UA trace     pH, UA 5.5     Protein, UA neg     Urobilinogen, UA 0.2     Nitrite, UA neg     Leukocytes, UA Trace    POCT URINE PREGNANCY      Result Value Ref Range   Preg Test, Ur Negative         Assessment & Plan:   Advised pt that she most likely has a virus. Informed pt that she needed to rest and drink a lot of fluids. Will provide the pt with pain medication as well as a mouthwash with  lidocaine and anti-nausea medication. Informed pt to use IBU/Tylenol as well.   Also advised the pt to return to the clinic or visit  the ED for a consideration of a LP if her headache worsens and is accompanied with nausea/emesis, her fever increases, or the ROM to her neck decreases.   Informed pt that she will be contacted in two days if lab results are abnormal.   Streptococcal sore throat - Plan: POCT rapid strep A, POCT CBC, Culture, Group A Strep  Abdominal pain, unspecified site - Plan: POCT UA - Microscopic Only, POCT urinalysis dipstick, POCT urine pregnancy, Comprehensive metabolic panel  Acute viral pharyngitis  Meds ordered this encounter  Medications  . Ibuprofen (ADVIL) 200 MG CAPS    Sig: Take 2 capsules by mouth as needed.  Marland Kitchen. HYDROcodone-acetaminophen (HYCET) 7.5-325 mg/15 ml solution    Sig: Take 10-15 mLs by mouth every 6 (six) hours as needed.    Dispense:  120 mL    Refill:  0  . Alum & Mag Hydroxide-Simeth (MAGIC MOUTHWASH W/LIDOCAINE) SOLN    Sig: Take 10 mLs by mouth every 2 (two) hours as needed for mouth pain.    Dispense:  360 mL    Refill:  0    Ok to use your pharmacy's formulary and mix in a 1:1 solution with viscous lidocaine  . promethazine (PHENERGAN) 25 MG tablet    Sig: Take 1 tablet (25 mg total) by mouth every 8 (eight) hours as needed for nausea or vomiting.    Dispense:  20 tablet    Refill:  0    I personally performed the services described in this documentation, which was scribed in my presence. The recorded information has been reviewed and considered, and addended by me as needed.  Jennifer SorensonEva Shaw, MD MPH

## 2013-08-02 NOTE — Patient Instructions (Signed)
Viral Pharyngitis Viral pharyngitis is a viral infection that produces redness, pain, and swelling (inflammation) of the throat. It can spread from person to person (contagious). CAUSES Viral pharyngitis is caused by inhaling a large amount of certain germs called viruses. Many different viruses cause viral pharyngitis. SYMPTOMS Symptoms of viral pharyngitis include:  Sore throat.  Tiredness.  Stuffy nose.  Low-grade fever.  Congestion.  Cough. TREATMENT Treatment includes rest, drinking plenty of fluids, and the use of over-the-counter medication (approved by your caregiver). HOME CARE INSTRUCTIONS   Drink enough fluids to keep your urine clear or pale yellow.  Eat soft, cold foods such as ice cream, frozen ice pops, or gelatin dessert.  Gargle with warm salt water (1 tsp salt per 1 qt of water).  If over age 7, throat lozenges may be used safely.  Only take over-the-counter or prescription medicines for pain, discomfort, or fever as directed by your caregiver. Do not take aspirin. To help prevent spreading viral pharyngitis to others, avoid:  Mouth-to-mouth contact with others.  Sharing utensils for eating and drinking.  Coughing around others. SEEK MEDICAL CARE IF:   You are better in a few days, then become worse.  You have a fever or pain not helped by pain medicines.  There are any other changes that concern you. Document Released: 12/21/2004 Document Revised: 06/05/2011 Document Reviewed: 05/19/2010 [employer] Patient Information 2014 [employer].  

## 2013-08-04 LAB — CULTURE, GROUP A STREP

## 2013-11-16 ENCOUNTER — Ambulatory Visit (INDEPENDENT_AMBULATORY_CARE_PROVIDER_SITE_OTHER): Payer: Federal, State, Local not specified - PPO

## 2013-11-16 ENCOUNTER — Ambulatory Visit (INDEPENDENT_AMBULATORY_CARE_PROVIDER_SITE_OTHER): Payer: Federal, State, Local not specified - PPO | Admitting: Family Medicine

## 2013-11-16 VITALS — BP 114/70 | HR 83 | Temp 97.8°F | Resp 20 | Ht 65.5 in | Wt 145.1 lb

## 2013-11-16 DIAGNOSIS — N390 Urinary tract infection, site not specified: Secondary | ICD-10-CM

## 2013-11-16 DIAGNOSIS — Z8742 Personal history of other diseases of the female genital tract: Secondary | ICD-10-CM

## 2013-11-16 DIAGNOSIS — R109 Unspecified abdominal pain: Secondary | ICD-10-CM

## 2013-11-16 LAB — POCT URINALYSIS DIPSTICK
Bilirubin, UA: NEGATIVE
Glucose, UA: NEGATIVE
Ketones, UA: NEGATIVE
Nitrite, UA: NEGATIVE
Protein, UA: NEGATIVE
Spec Grav, UA: <=1.005
Urobilinogen, UA: 0.2
pH, UA: 6

## 2013-11-16 LAB — POCT CBC
Granulocyte percent: 65.5 % (ref 37–80)
HCT, POC: 42 % (ref 37.7–47.9)
Hemoglobin: 13.9 g/dL (ref 12.2–16.2)
Lymph, poc: 1.7 (ref 0.6–3.4)
MCH, POC: 29.2 pg (ref 27–31.2)
MCHC: 33.2 g/dL (ref 31.8–35.4)
MCV: 88.1 fL (ref 80–97)
MID (cbc): 0.5 (ref 0–0.9)
MPV: 8.2 fL (ref 0–99.8)
POC Granulocyte: 4.3 (ref 2–6.9)
POC LYMPH PERCENT: 26.6 % (ref 10–50)
POC MID %: 7.9 %M (ref 0–12)
Platelet Count, POC: 219 10*3/uL (ref 142–424)
RBC: 4.76 M/uL (ref 4.04–5.48)
RDW, POC: 12.9 %
WBC: 6.5 10*3/uL (ref 4.6–10.2)

## 2013-11-16 LAB — POCT UA - MICROSCOPIC ONLY
Casts, Ur, LPF, POC: NEGATIVE
Crystals, Ur, HPF, POC: NEGATIVE
Yeast, UA: NEGATIVE

## 2013-11-16 LAB — COMPLETE METABOLIC PANEL WITH GFR
ALT: 12 U/L (ref 0–35)
AST: 17 U/L (ref 0–37)
Albumin: 4.4 g/dL (ref 3.5–5.2)
Alkaline Phosphatase: 47 U/L (ref 39–117)
Chloride: 103 mEq/L (ref 96–112)
Creat: 0.62 mg/dL (ref 0.50–1.10)
GFR, Est Non African American: >89 mL/min
Potassium: 3.9 mEq/L (ref 3.5–5.3)
Sodium: 137 mEq/L (ref 135–145)
Total Bilirubin: 0.4 mg/dL (ref 0.2–1.2)
Total Protein: 7.5 g/dL (ref 6.0–8.3)

## 2013-11-16 LAB — COMPLETE METABOLIC PANEL WITHOUT GFR
BUN: 6 mg/dL (ref 6–23)
CO2: 28 meq/L (ref 19–32)
Calcium: 9 mg/dL (ref 8.4–10.5)
GFR, Est African American: >89 mL/min
Glucose, Bld: 96 mg/dL (ref 70–99)

## 2013-11-16 MED ORDER — NITROFURANTOIN MONOHYD MACRO 100 MG PO CAPS: 100.0000 mg | ORAL_CAPSULE | Freq: Two times a day (BID) | ORAL | Status: DC

## 2013-11-16 MED ORDER — HYDROCODONE-IBUPROFEN 5-200 MG PO TABS: 1.0000 | ORAL_TABLET | Freq: Three times a day (TID) | ORAL | Status: DC | PRN

## 2013-11-16 MED ORDER — HYDROCODONE-ACETAMINOPHEN 10-325 MG PO TABS: 1.0000 | ORAL_TABLET | Freq: Three times a day (TID) | ORAL | Status: DC | PRN

## 2013-11-16 NOTE — Progress Notes (Signed)
Chief Complaint:  Chief Complaint  Patient presents with  . Abdominal Pain    Started Yesterday    HPI: Jennifer Shannon is a 43 y.o. female who is here for  Yesterday, she has 10/10 pain, comes and goes, she has pain all over. She was able to have a BM yesterday , it was soft.  She denis  nausea.  She has had no fevers or chills or back pain. She has never had pain like this before. LMP was November 12, 2013. She is still on her period. She denies any heavy bleeding with her cycles.  She is having only stomach pain, no other sxs such as n/v/diarrhea/constipation/fevers/chills/rashes, She has been drinking adn eating without any problems. Upon chart review she has a history of ovarian cysts.  CT SCAN OF THE PELVIS WITH CONTRAST  Additional images through the pelvis after oral and IV contrast demonstrate a 2.4 cm cyst of the right ovary. There is a 3 cm fibroid of the uterine fundus with a probable 2 cm lower uterine fibroid. There is no inflammatory change or other abnormality.  IMPRESSION  1. 2.4 cm cyst on the right ovary with small fibroids.  2. No acute abnormality within the pelvis.   No past medical history on file. No past surgical history on file. History   Social History  . Marital Status: Married    Spouse Name: N/A    Number of Children: N/A  . Years of Education: N/A   Social History Main Topics  . Smoking status: Never Smoker   . Smokeless tobacco: Never Used  . Alcohol Use: No  . Drug Use: No  . Sexual Activity: None   Other Topics Concern  . None   Social History Narrative  . None   No family history on file. No Known Allergies Prior to Admission medications   Medication Sig Start Date End Date Taking? Authorizing Provider  Ibuprofen (ADVIL) 200 MG CAPS Take 2 capsules by mouth as needed.   Yes Historical Provider, MD     ROS: The patient denies fevers, chills, night sweats, unintentional weight loss, chest pain, palpitations, wheezing, dyspnea  on exertion, nausea, vomiting,  dysuria, hematuria, melena, numbness, weakness, or tingling.   All other systems have been reviewed and were otherwise negative with the exception of those mentioned in the HPI and as above.    PHYSICAL EXAM: Filed Vitals:   11/16/13 1238  BP: 114/70  Pulse: 83  Temp: 97.8 F (36.6 C)  Resp: 20   Filed Vitals:   11/16/13 1238  Height: 5' 5.5" (1.664 m)  Weight: 145 lb 2 oz (65.828 kg)   Body mass index is 23.77 kg/(m^2).  General: Alert, no acute distress HEENT:  Normocephalic, atraumatic, oropharynx patent. EOMI, PERRLA Cardiovascular:  Regular rate and rhythm, no rubs murmurs or gallops.  No Carotid bruits, radial pulse intact. No pedal edema.  Respiratory: Clear to auscultation bilaterally.  No wheezes, rales, or rhonchi.  No cyanosis, no use of accessory musculature GI: No organomegaly, abdomen is soft and + minimally tender, positive bowel sounds.  No masses. Skin: No rashes. Neurologic: Facial musculature symmetric. Psychiatric: Patient is appropriate throughout our interaction. Lymphatic: No cervical lymphadenopathy Musculoskeletal: Gait intact.   LABS: Results for orders placed in visit on 11/16/13  POCT UA - MICROSCOPIC ONLY      Result Value Ref Range   WBC, Ur, HPF, POC 0-1     RBC, urine, microscopic 1-6  Bacteria, U Microscopic trace     Mucus, UA trace     Epithelial cells, urine per micros 0-2     Crystals, Ur, HPF, POC neg     Casts, Ur, LPF, POC neg     Yeast, UA neg    POCT URINALYSIS DIPSTICK      Result Value Ref Range   Color, UA yellow     Clarity, UA clear     Glucose, UA neg     Bilirubin, UA neg     Ketones, UA neg     Spec Grav, UA <=1.005     Blood, UA large     pH, UA 6.0     Protein, UA neg     Urobilinogen, UA 0.2     Nitrite, UA neg     Leukocytes, UA Trace    POCT CBC      Result Value Ref Range   WBC 6.5  4.6 - 10.2 K/uL   Lymph, poc 1.7  0.6 - 3.4   POC LYMPH PERCENT 26.6  10 - 50 %L    MID (cbc) 0.5  0 - 0.9   POC MID % 7.9  0 - 12 %M   POC Granulocyte 4.3  2 - 6.9   Granulocyte percent 65.5  37 - 80 %G   RBC 4.76  4.04 - 5.48 M/uL   Hemoglobin 13.9  12.2 - 16.2 g/dL   HCT, POC 16.1  09.6 - 47.9 %   MCV 88.1  80 - 97 fL   MCH, POC 29.2  27 - 31.2 pg   MCHC 33.2  31.8 - 35.4 g/dL   RDW, POC 04.5     Platelet Count, POC 219  142 - 424 K/uL   MPV 8.2  0 - 99.8 fL     EKG/XRAY:   Primary read interpreted by Dr. Conley Rolls at [facility]. No obstruction No acute cardiopulmonary process No obvious kidney stones.    ASSESSMENT/PLAN: Encounter Diagnoses  Name Primary?  . Abdominal pain, other specified site Yes  . History of ovarian cyst   . Urinary tract infection, site not specified    43 y.o Arabic speaking female with acute onset of diffuse abd pain, not related to food. She ahs a history of ovarian cyst on CT scan from prior Macrobid Norco for pain Labs pending IF continues then will need abd/pelvic US    Gross sideeffects, risk and benefits, and alternatives of medications d/w patient. Patient is aware that all medications have potential sideeffects and we are unable to predict every sideeffect or drug-drug interaction that may occur.  Hamilton Capri PHUONG, DO 11/16/2013 2:38 PM

## 2013-11-16 NOTE — Patient Instructions (Signed)
Ovarian Cyst An ovarian cyst is a fluid-filled sac that forms on an ovary. The ovaries are small organs that produce eggs in women. Various types of cysts can form on the ovaries. Most are not cancerous. Many do not cause problems, and they often go away on their own. Some may cause symptoms and require treatment. Common types of ovarian cysts include:  Functional cysts--These cysts may occur every month during the menstrual cycle. This is normal. The cysts usually go away with the next menstrual cycle if the woman does not get pregnant. Usually, there are no symptoms with a functional cyst.  Endometrioma cysts--These cysts form from the tissue that lines the uterus. They are also called "chocolate cysts" because they become filled with blood that turns brown. This type of cyst can cause pain in the lower abdomen during intercourse and with your menstrual period.  Cystadenoma cysts--This type develops from the cells on the outside of the ovary. These cysts can get very big and cause lower abdomen pain and pain with intercourse. This type of cyst can twist on itself, cut off its blood supply, and cause severe pain. It can also easily rupture and cause a lot of pain.  Dermoid cysts--This type of cyst is sometimes found in both ovaries. These cysts may contain different kinds of body tissue, such as skin, teeth, hair, or cartilage. They usually do not cause symptoms unless they get very big.  Theca lutein cysts--These cysts occur when too much of a certain hormone (human chorionic gonadotropin) is produced and overstimulates the ovaries to produce an egg. This is most common after procedures used to assist with the conception of a baby (in vitro fertilization). CAUSES   Fertility drugs can cause a condition in which multiple large cysts are formed on the ovaries. This is called ovarian hyperstimulation syndrome.  A condition called polycystic ovary syndrome can cause hormonal imbalances that can lead to  nonfunctional ovarian cysts. SIGNS AND SYMPTOMS  Many ovarian cysts do not cause symptoms. If symptoms are present, they may include:  Pelvic pain or pressure.  Pain in the lower abdomen.  Pain during sexual intercourse.  Increasing girth (swelling) of the abdomen.  Abnormal menstrual periods.  Increasing pain with menstrual periods.  Stopping having menstrual periods without being pregnant. DIAGNOSIS  These cysts are commonly found during a routine or annual pelvic exam. Tests may be ordered to find out more about the cyst. These tests may include:  Ultrasound.  X-ray of the pelvis.  CT scan.  MRI.  Blood tests. TREATMENT  Many ovarian cysts go away on their own without treatment. Your health care provider may want to check your cyst regularly for 2-3 months to see if it changes. For women in menopause, it is particularly important to monitor a cyst closely because of the higher rate of ovarian cancer in menopausal women. When treatment is needed, it may include any of the following:  A procedure to drain the cyst (aspiration). This may be done using a long needle and ultrasound. It can also be done through a laparoscopic procedure. This involves using a thin, lighted tube with a tiny camera on the end (laparoscope) inserted through a small incision.  Surgery to remove the whole cyst. This may be done using laparoscopic surgery or an open surgery involving a larger incision in the lower abdomen.  Hormone treatment or birth control pills. These methods are sometimes used to help dissolve a cyst. HOME CARE INSTRUCTIONS   Only take over-the-counter   or prescription medicines as directed by your health care provider.  Follow up with your health care provider as directed.  Get regular pelvic exams and Pap tests. SEEK MEDICAL CARE IF:   Your periods are late, irregular, or painful, or they stop.  Your pelvic pain or abdominal pain does not go away.  Your abdomen becomes  larger or swollen.  You have pressure on your bladder or trouble emptying your bladder completely.  You have pain during sexual intercourse.  You have feelings of fullness, pressure, or discomfort in your stomach.  You lose weight for no apparent reason.  You feel generally ill.  You become constipated.  You lose your appetite.  You develop acne.  You have an increase in body and facial hair.  You are gaining weight, without changing your exercise and eating habits.  You think you are pregnant. SEEK IMMEDIATE MEDICAL CARE IF:   You have increasing abdominal pain.  You feel sick to your stomach (nauseous), and you throw up (vomit).  You develop a fever that comes on suddenly.  You have abdominal pain during a bowel movement.  Your menstrual periods become heavier than usual. MAKE SURE YOU:  Understand these instructions.  Will watch your condition.  Will get help right away if you are not doing well or get worse. Document Released: 03/13/2005 Document Revised: 03/18/2013 Document Reviewed: 11/18/2012 [facility] Patient Information 2015 [place], [place]. This information is not intended to replace advice given to you by your health care provider. Make sure you discuss any questions you have with your health care provider. Urinary Tract Infection A urinary tract infection (UTI) can occur any place along the urinary tract. The tract includes the kidneys, ureters, bladder, and urethra. A type of germ called bacteria often causes a UTI. UTIs are often helped with antibiotic medicine.  HOME CARE   If given, take antibiotics as told by your doctor. Finish them even if you start to feel better.  Drink enough fluids to keep your pee (urine) clear or pale yellow.  Avoid tea, drinks with caffeine, and bubbly (carbonated) drinks.  Pee often. Avoid holding your pee in for a long time.  Pee before and after having sex (intercourse).  Wipe from front to back after you poop  (bowel movement) if you are a woman. Use each tissue only once. GET HELP RIGHT AWAY IF:   You have back pain.  You have lower belly (abdominal) pain.  You have chills.  You feel sick to your stomach (nauseous).  You throw up (vomit).  Your burning or discomfort with peeing does not go away.  You have a fever.  Your symptoms are not better in 3 days. MAKE SURE YOU:   Understand these instructions.  Will watch your condition.  Will get help right away if you are not doing well or get worse. Document Released: 08/30/2007 Document Revised: 12/06/2011 Document Reviewed: 10/12/2011 [facility] Patient Information 2015 [place], [place]. This information is not intended to replace advice given to you by your health care provider. Make sure you discuss any questions you have with your health care provider.

## 2013-11-17 ENCOUNTER — Telehealth: Payer: Self-pay

## 2013-11-17 LAB — URINE CULTURE: Colony Count: >=100000

## 2013-11-17 NOTE — Telephone Encounter (Signed)
Letter written- LM letter in pick up drawer.

## 2013-11-17 NOTE — Telephone Encounter (Signed)
Edmund Hilda states his wife was seen for stomach pains and he need a note for yesterday and today for his job Please call [phone]

## 2013-11-28 ENCOUNTER — Encounter: Payer: Self-pay | Admitting: Family Medicine

## 2014-11-22 ENCOUNTER — Ambulatory Visit (INDEPENDENT_AMBULATORY_CARE_PROVIDER_SITE_OTHER): Payer: Federal, State, Local not specified - PPO | Admitting: Physician Assistant

## 2014-11-22 VITALS — BP 126/78 | HR 98 | Temp 98.9°F | Resp 18 | Ht 66.0 in | Wt 139.0 lb

## 2014-11-22 DIAGNOSIS — J209 Acute bronchitis, unspecified: Secondary | ICD-10-CM | POA: Diagnosis not present

## 2014-11-22 MED ORDER — HYDROCOD POLST-CPM POLST ER 10-8 MG/5ML PO SUER: 5.0000 mL | Freq: Every evening | ORAL | Status: DC | PRN

## 2014-11-22 MED ORDER — BENZONATATE 100 MG PO CAPS: 100.0000 mg | ORAL_CAPSULE | Freq: Three times a day (TID) | ORAL | Status: DC | PRN

## 2014-11-22 MED ORDER — GUAIFENESIN ER 1200 MG PO TB12
1.0000 | ORAL_TABLET | Freq: Two times a day (BID) | ORAL | Status: DC | PRN
Start: 2014-11-22 — End: 2017-02-10

## 2014-11-22 MED ORDER — AZITHROMYCIN 250 MG PO TABS: ORAL_TABLET | ORAL | Status: DC

## 2014-11-22 NOTE — Patient Instructions (Addendum)
Please hydrate well with 64 oz of water per day (almost 4 regular sized water bottles)  Acute Bronchitis Bronchitis is inflammation of the airways that extend from the windpipe into the lungs (bronchi). The inflammation often causes mucus to develop. This leads to a cough, which is the most common symptom of bronchitis.  In acute bronchitis, the condition usually develops suddenly and goes away over time, usually in a couple weeks. Smoking, allergies, and asthma can make bronchitis worse. Repeated episodes of bronchitis may cause further lung problems.  CAUSES Acute bronchitis is most often caused by the same virus that causes a cold. The virus can spread from person to person (contagious) through coughing, sneezing, and touching contaminated objects. SIGNS AND SYMPTOMS   Cough.   Fever.   Coughing up mucus.   Body aches.   Chest congestion.   Chills.   Shortness of breath.   Sore throat.  DIAGNOSIS  Acute bronchitis is usually diagnosed through a physical exam. Your health care provider will also ask you questions about your medical history. Tests, such as chest X-rays, are sometimes done to rule out other conditions.  TREATMENT  Acute bronchitis usually goes away in a couple weeks. Oftentimes, no medical treatment is necessary. Medicines are sometimes given for relief of fever or cough. Antibiotic medicines are usually not needed but may be prescribed in certain situations. In some cases, an inhaler may be recommended to help reduce shortness of breath and control the cough. A cool mist vaporizer may also be used to help thin bronchial secretions and make it easier to clear the chest.  HOME CARE INSTRUCTIONS  Get plenty of rest.   Drink enough fluids to keep your urine clear or pale yellow (unless you have a medical condition that requires fluid restriction). Increasing fluids may help thin your respiratory secretions (sputum) and reduce chest congestion, and it will  prevent dehydration.   Take medicines only as directed by your health care provider.  If you were prescribed an antibiotic medicine, finish it all even if you start to feel better.  Avoid smoking and secondhand smoke. Exposure to cigarette smoke or irritating chemicals will make bronchitis worse. If you are a smoker, consider using nicotine gum or skin patches to help control withdrawal symptoms. Quitting smoking will help your lungs heal faster.   Reduce the chances of another bout of acute bronchitis by washing your hands frequently, avoiding people with cold symptoms, and trying not to touch your hands to your mouth, nose, or eyes.   Keep all follow-up visits as directed by your health care provider.  SEEK MEDICAL CARE IF: Your symptoms do not improve after 1 week of treatment.  SEEK IMMEDIATE MEDICAL CARE IF:  You develop an increased fever or chills.   You have chest pain.   You have severe shortness of breath.  You have bloody sputum.   You develop dehydration.  You faint or repeatedly feel like you are going to pass out.  You develop repeated vomiting.  You develop a severe headache. MAKE SURE YOU:   Understand these instructions.  Will watch your condition.  Will get help right away if you are not doing well or get worse. Document Released: 04/20/2004 Document Revised: 07/28/2013 Document Reviewed: 09/03/2012 [facility] Patient Information 2015 [place], [place]. This information is not intended to replace advice given to you by your health care provider. Make sure you discuss any questions you have with your health care provider.

## 2014-11-23 NOTE — Progress Notes (Signed)
[facility] [address] [place] [address] [phone]  Date:  11/22/2014   Name:  Jennifer Shannon   DOB:  November 07, 1970   MRN:  [id number]  PCP:  Tally Due, MD    History of Present Illness:  Jennifer Shannon is a 44 y.o. female patient who presents to [facility] for chief complaint is here today for chief complaint of cough for 1 week.  It is non-productive.  No fever, chills, bodyaches, nasal congestion, dizziness or ear discomfort.  She has had rhinorrhea.  She feels like it is not improving, but staying the same.  She has no sick contacts.  She does not hydrate well.  Some abdominal pain secondary to the coughing.     There are no active problems to display for this patient.    History reviewed. No pertinent past medical history.  History reviewed. No pertinent past surgical history.  Social History  Substance Use Topics  . Smoking status: Never Smoker   . Smokeless tobacco: Never Used  . Alcohol Use: No    History reviewed. No pertinent family history.  No Known Allergies  Medication list has been reviewed and updated.  Current Outpatient Prescriptions on File Prior to Visit  Medication Sig Dispense Refill  . Ibuprofen (ADVIL) 200 MG CAPS Take 2 capsules by mouth as needed.    Marland Kitchen HYDROcodone-acetaminophen (NORCO) 10-325 MG per tablet Take 1 tablet by mouth every 8 (eight) hours as needed. Do not take extra tylenol with this. You may take NSAID ie advil if needed.Take with stool softener. 30 tablet 0  . nitrofurantoin, macrocrystal-monohydrate, (MACROBID) 100 MG capsule Take 1 capsule (100 mg total) by mouth 2 (two) times daily. (Patient not taking: Reported on 11/22/2014) 14 capsule 0   Current Facility-Administered Medications on File Prior to Visit  Medication Dose Route Frequency Provider Last Rate Last Dose  . albuterol (PROVENTIL) (2.5 MG/3ML) 0.083% nebulizer solution 2.5 mg  2.5 mg Nebulization Once Peyton Najjar, MD        ROS ROS  otherwise unremarkable unless listed above.    Physical Examination: BP 126/78 mmHg  Pulse 98  Temp(Src) 98.9 F (37.2 C) (Oral)  Resp 18  Ht 5\' 6"  (1.676 m)  Wt 139 lb (63.05 kg)  BMI 22.45 kg/m2  SpO2 98%  LMP 10/31/2014 Ideal Body Weight: Weight in (lb) to have BMI = 25: 154.6  Physical Exam  HENT:  Right Ear: Tympanic membrane, external ear and ear canal normal.  Left Ear: Tympanic membrane, external ear and ear canal normal.  Nose: Rhinorrhea present. No mucosal edema. Right sinus exhibits no maxillary sinus tenderness and no frontal sinus tenderness. Left sinus exhibits no maxillary sinus tenderness and no frontal sinus tenderness.  Mouth/Throat: Posterior oropharyngeal erythema present. No oropharyngeal exudate or posterior oropharyngeal edema.  Pulmonary/Chest: No apnea. No respiratory distress. She has no decreased breath sounds. She has no wheezes. She has no rhonchi.  Difficulty breathing      Assessment and Plan: 44 year old female is here today for non-productive cough for the last week, that is not clearing up.   Will offer abx at this time.   -Treating supportively of cough.  Advised to increase hydration--64oz  1. Acute bronchitis, unspecified organism - azithromycin (ZITHROMAX) 250 MG tablet; Take 2 tabs PO x 1 dose, then 1 tab PO QD x 4 days  Dispense: 6 tablet; Refill: 0 - chlorpheniramine-HYDROcodone (TUSSIONEX PENNKINETIC ER) 10-8 MG/5ML SUER; Take 5  mLs by mouth at bedtime as needed for cough.  Dispense: 70 mL; Refill: 0 - Guaifenesin (MUCINEX MAXIMUM STRENGTH) 1200 MG TB12; Take 1 tablet (1,200 mg total) by mouth every 12 (twelve) hours as needed.  Dispense: 14 tablet; Refill: 1 - benzonatate (TESSALON) 100 MG capsule; Take 1-2 capsules (100-200 mg total) by mouth 3 (three) times daily as needed for cough.  Dispense: 40 capsule; Refill: 0   Trena Platt, PA-C [facility] 11/23/2014 11:01 AM

## 2014-11-25 ENCOUNTER — Ambulatory Visit (INDEPENDENT_AMBULATORY_CARE_PROVIDER_SITE_OTHER): Payer: Federal, State, Local not specified - PPO

## 2014-11-25 ENCOUNTER — Ambulatory Visit (INDEPENDENT_AMBULATORY_CARE_PROVIDER_SITE_OTHER): Payer: Federal, State, Local not specified - PPO | Admitting: Family Medicine

## 2014-11-25 VITALS — BP 120/70 | HR 97 | Temp 98.2°F | Resp 20 | Ht 64.5 in | Wt 140.2 lb

## 2014-11-25 DIAGNOSIS — J209 Acute bronchitis, unspecified: Secondary | ICD-10-CM

## 2014-11-25 LAB — POCT CBC
Granulocyte percent: 38.9 %G (ref 37–80)
HEMATOCRIT: 41.4 % (ref 37.7–47.9)
HEMOGLOBIN: 13.4 g/dL (ref 12.2–16.2)
LYMPH, POC: 2.8 (ref 0.6–3.4)
MCH, POC: 28.2 pg (ref 27–31.2)
MCHC: 32.5 g/dL (ref 31.8–35.4)
MCV: 86.9 fL (ref 80–97)
MID (cbc): 0.6 (ref 0–0.9)
MPV: 7.5 fL (ref 0–99.8)
POC GRANULOCYTE: 2.2 (ref 2–6.9)
POC LYMPH %: 50.3 % — AB (ref 10–50)
POC MID %: 10.8 % (ref 0–12)
Platelet Count, POC: 253 10*3/uL (ref 142–424)
RBC: 4.76 M/uL (ref 4.04–5.48)
RDW, POC: 13.2 %
WBC: 5.6 10*3/uL (ref 4.6–10.2)

## 2014-11-25 MED ORDER — HYDROCOD POLST-CPM POLST ER 10-8 MG/5ML PO SUER: 5.0000 mL | Freq: Every evening | ORAL | Status: DC | PRN

## 2014-11-25 NOTE — Progress Notes (Signed)
  Subjective:     Jennifer Shannon is a 44 y.o. female who presents for evaluation of symptoms of a URI. Symptoms include cough described as nonproductive, low grade fever and nasal congestion. Onset of symptoms was 1 week ago, and has been stable since that time. Treatment to date: antibiotics, cough suppressants and decongestants.  She is feeling slightly better and was seen on 8/29 dx with acute bronchitis, given azithromycin, tussinex all w/o relief.  She denies nightsweats or fevers.  No recent travel or risk factors for PE.    The following portions of the patient's history were reviewed and updated as appropriate: allergies, current medications, past family history, past medical history, past social history, past surgical history and problem list.  Review of Systems Pertinent items are noted in HPI.   Objective:    BP 120/70 mmHg  Pulse 97  Temp(Src) 98.2 F (36.8 C) (Oral)  Resp 20  Ht 5' 4.5" (1.638 m)  Wt 140 lb 3.2 oz (63.594 kg)  BMI 23.70 kg/m2  SpO2 97%  LMP 10/31/2014 General appearance: alert, cooperative and appears stated age Head: Normocephalic, without obvious abnormality, atraumatic Nose: turbinates swollen Throat: O/P clear Lungs: clear to auscultation bilaterally Heart: regular rate and rhythm, S1, S2 normal, no murmur, click, rub or gallop   Assessment:    viral upper respiratory illness   Plan:    Continue with current tx as no red flags concerning for underlying PNA or TB including fever, chills, nightsweats, productive sputum or abnormalities auscilated form the lungs.    Will obtain CXR to r/o underlying process at this time due to persistent Sx  Addendum: CXR reassuring w/o acute infiltrate.  Precepted with Dr. Clelia Croft and we will obtain POC CBC and consider B2 agonist inhaler.   POC CBC w/ R shift and reassuring.  F/U if needed.

## 2014-11-26 NOTE — Progress Notes (Signed)
Patient ID: Jennifer Shannon, female   DOB: [birth year], 44 y.o.   MRN: [id number]   Chief Complaint  Patient presents with  . Follow-up    cough    Pt independently evaluated by myself as well.  During my eval, pt had a continuous deep spasmodic hacking dry cough.  She cleaerly feels horrible and husband is quite worried aobut it.  Provided much reassurance that likely she has got secondary viral infection after initial bronchitis. Pt will complete zpack tomorrow and no role for additional antibiotics at this time with reassuring cbc and cxr today as well as lungs clear on auscultation, o2 sat fine, no risk for tb.  Did not respond to the tessalon but reports that the tussionex did help sig though she is out of that now so I refilled.  Cont rest and supportive care but call or RTC if sxs do not start to improve soon though reminded that it may take sev wks for cough to completely resolve. Offered neb trx in office but pt declined as has alb neb at home which she is using w/o sig relief.  Results for orders placed or performed in visit on 11/25/14  POCT CBC  Result Value Ref Range   WBC 5.6 4.6 - 10.2 K/uL   Lymph, poc 2.8 0.6 - 3.4   POC LYMPH PERCENT 50.3 (A) 10 - 50 %L   MID (cbc) 0.6 0 - 0.9   POC MID % 10.8 0 - 12 %M   POC Granulocyte 2.2 2 - 6.9   Granulocyte percent 38.9 37 - 80 %G   RBC 4.76 4.04 - 5.48 M/uL   Hemoglobin 13.4 12.2 - 16.2 g/dL   HCT, POC 42.5 95.6 - 47.9 %   MCV 86.9 80 - 97 fL   MCH, POC 28.2 27 - 31.2 pg   MCHC 32.5 31.8 - 35.4 g/dL   RDW, POC 38.7 %   Platelet Count, POC 253 142 - 424 K/uL   MPV 7.5 0 - 99.8 fL   X-ray Chest Pa And Lateral  11/25/2014   CLINICAL DATA:  Cough for 2 weeks.  Acute bronchitis.  EXAM: CHEST  2 VIEW  COMPARISON:  11/16/2013  FINDINGS: The heart size and mediastinal contours are within normal limits. Both lungs are clear. The visualized skeletal structures are unremarkable.  IMPRESSION: No active cardiopulmonary disease.   Electronically  Signed   By: Myles Rosenthal M.D.   On: 11/25/2014 19:44     Reviewed documentation and xray and agree w/ assessment and plan. Norberto Sorenson, MD MPH

## 2016-03-17 ENCOUNTER — Encounter (HOSPITAL_COMMUNITY): Payer: Self-pay | Admitting: *Deleted

## 2016-03-17 ENCOUNTER — Ambulatory Visit (HOSPITAL_COMMUNITY)
Admission: EM | Admit: 2016-03-17 | Discharge: 2016-03-17 | Disposition: A | Discharge status: Home / Self Care | Payer: Federal, State, Local not specified - PPO | Attending: Family Medicine | Admitting: Family Medicine

## 2016-03-17 DIAGNOSIS — R51 Headache: Secondary | ICD-10-CM | POA: Diagnosis not present

## 2016-03-17 DIAGNOSIS — R519 Headache, unspecified: Secondary | ICD-10-CM

## 2016-03-17 MED ORDER — KETOROLAC TROMETHAMINE 30 MG/ML IJ SOLN
30.0000 mg | Freq: Once | INTRAMUSCULAR | Status: AC
  Administered 2016-03-17: 30 mg via INTRAMUSCULAR

## 2016-03-17 MED ORDER — GABAPENTIN (ONCE-DAILY) 300 MG PO TABS: 1.0000 | ORAL_TABLET | Freq: Two times a day (BID) | ORAL | 0 refills | Status: DC

## 2016-03-17 MED ORDER — DICLOFENAC POTASSIUM 50 MG PO TABS: 50.0000 mg | ORAL_TABLET | Freq: Three times a day (TID) | ORAL | 0 refills | Status: DC

## 2016-03-17 MED ORDER — KETOROLAC TROMETHAMINE 30 MG/ML IJ SOLN
INTRAMUSCULAR | Status: AC
  Filled 2016-03-17: qty 1

## 2016-03-17 NOTE — Discharge Instructions (Signed)
Use medicine as see specialist as possible.

## 2016-03-17 NOTE — ED Provider Notes (Signed)
[facility]    CSN: [id number] Arrival date & time: 03/17/16  1808     History   Chief Complaint Chief Complaint  Patient presents with  . Facial Pain    HPI Jennifer Shannon is a 45 y.o. female.   The history is provided by the patient and the spouse.  Dental Pain  Location:  Lower Quality:  Throbbing Severity:  Moderate Onset quality:  Gradual Duration:  1 month Progression:  Worsening Chronicity:  Recurrent Context: malocclusion   Relieved by:  Nothing Worsened by:  Nothing Ineffective treatments:  None tried Associated symptoms: facial pain   Associated symptoms: no fever     History reviewed. No pertinent past medical history.  There are no active problems to display for this patient.   History reviewed. No pertinent surgical history.  OB History    No data available       Home Medications    Prior to Admission medications   Medication Sig Start Date End Date Taking? Authorizing Provider  azithromycin (ZITHROMAX) 250 MG tablet Take 2 tabs PO x 1 dose, then 1 tab PO QD x 4 days 11/22/14   Collie SiadStephanie D English, PA  benzonatate (TESSALON) 100 MG capsule Take 1-2 capsules (100-200 mg total) by mouth 3 (three) times daily as needed for cough. 11/22/14   Collie SiadStephanie D English, PA  chlorpheniramine-HYDROcodone (TUSSIONEX PENNKINETIC ER) 10-8 MG/5ML SUER Take 5 mLs by mouth at bedtime as needed for cough. 11/25/14   Sherren MochaEva N Shaw, MD  diclofenac (CATAFLAM) 50 MG tablet Take 1 tablet (50 mg total) by mouth 3 (three) times daily. 03/17/16   Linna HoffJames D Kalis Friese, MD  Gabapentin, Once-Daily, 300 MG TABS Take 1 tablet by mouth 2 (two) times daily. 03/17/16   Linna HoffJames D Clell Trahan, MD  Guaifenesin [facility](MUCINEX MAXIMUM STRENGTH) 1200 MG TB12 Take 1 tablet (1,200 mg total) by mouth every 12 (twelve) hours as needed. 11/22/14   Collie SiadStephanie D English, PA  Ibuprofen (ADVIL) 200 MG CAPS Take 2 capsules by mouth as needed.    Historical Provider, MD    Family History History reviewed. No  pertinent family history.  Social History Social History  Substance Use Topics  . Smoking status: Never Smoker  . Smokeless tobacco: Never Used  . Alcohol use No     Allergies   Patient has no known allergies.   Review of Systems Review of Systems  Constitutional: Negative.  Negative for fever.  HENT: Positive for dental problem.   All other systems reviewed and are negative.    Physical Exam Triage Vital Signs ED Triage Vitals  Enc Vitals Group     BP 03/17/16 1832 148/70     Pulse Rate 03/17/16 1832 78     Resp 03/17/16 1832 18     Temp 03/17/16 1832 98.6 F (37 C)     Temp Source 03/17/16 1832 Oral     SpO2 03/17/16 1832 100 %     Weight --      Height --      Head Circumference --      Peak Flow --      Pain Score 03/17/16 1835 8     Pain Loc --      Pain Edu? --      Excl. in GC? --    No data found.   Updated Vital Signs BP 148/70 (BP Location: Right Arm)   Pulse 78   Temp 98.6 F (37 C) (Oral)   Resp 18  LMP 02/19/2016   SpO2 100%   Visual Acuity Right Eye Distance:   Left Eye Distance:   Bilateral Distance:    Right Eye Near:   Left Eye Near:    Bilateral Near:     Physical Exam  Constitutional: She is oriented to person, place, and time. She appears well-developed and well-nourished. She appears distressed.  HENT:  Right Ear: External ear normal.  Left Ear: External ear normal.  Mouth/Throat: Oropharynx is clear and moist.  Acute right mandibular pain, no focal problem seen.  Neck: Normal range of motion. Neck supple.  Lymphadenopathy:    She has no cervical adenopathy.  Neurological: She is alert and oriented to person, place, and time. No cranial nerve deficit.  Skin: Skin is warm and dry.     UC Treatments / Results  Labs (all labs ordered are listed, but only abnormal results are displayed) Labs Reviewed - No data to display  EKG  EKG Interpretation None       Radiology No results  found.  Procedures Procedures (including critical care time)  Medications Ordered in UC Medications  ketorolac (TORADOL) 30 MG/ML injection 30 mg (30 mg Intramuscular Given 03/17/16 1912)     Initial Impression / Assessment and Plan / UC Course  I have reviewed the triage vital signs and the nursing notes.  Pertinent labs & imaging results that were available during my care of the patient were reviewed by me and considered in my medical decision making (see chart for details).  Clinical Course       Final Clinical Impressions(s) / UC Diagnoses   Final diagnoses:  Right sided facial pain    New Prescriptions Discharge Medication List as of 03/17/2016  7:06 PM    START taking these medications   Details  diclofenac (CATAFLAM) 50 MG tablet Take 1 tablet (50 mg total) by mouth 3 (three) times daily., Starting Fri 03/17/2016, Print    Gabapentin, Once-Daily, 300 MG TABS Take 1 tablet by mouth 2 (two) times daily., Starting Fri 03/17/2016, Print         Linna HoffJames D Jaxx Huish, MD 03/23/16 2053

## 2016-03-17 NOTE — ED Triage Notes (Signed)
r  Side  Facial  Pain    X     sev   Weeks  Has  Been  To  Dentist       And  ent  Dr  As   Well    She  Was  On  Anti  Biotics  For a  While  Which the  Dentist  Put  Her  On  And  Husband  York SpanielSaid the  ent  Took  Her off

## 2017-02-10 ENCOUNTER — Encounter (HOSPITAL_COMMUNITY): Payer: Self-pay | Admitting: Family Medicine

## 2017-02-10 ENCOUNTER — Ambulatory Visit (HOSPITAL_COMMUNITY)
Admission: EM | Admit: 2017-02-10 | Discharge: 2017-02-10 | Disposition: A | Discharge status: Home / Self Care | Payer: Federal, State, Local not specified - PPO | Attending: Emergency Medicine | Admitting: Emergency Medicine

## 2017-02-10 DIAGNOSIS — R059 Cough, unspecified: Secondary | ICD-10-CM

## 2017-02-10 DIAGNOSIS — J9801 Acute bronchospasm: Secondary | ICD-10-CM

## 2017-02-10 DIAGNOSIS — R05 Cough: Secondary | ICD-10-CM | POA: Diagnosis not present

## 2017-02-10 DIAGNOSIS — J069 Acute upper respiratory infection, unspecified: Secondary | ICD-10-CM

## 2017-02-10 MED ORDER — PREDNISONE 20 MG PO TABS
ORAL_TABLET | ORAL | Status: AC
  Filled 2017-02-10: qty 3

## 2017-02-10 MED ORDER — PREDNISONE 20 MG PO TABS
60.0000 mg | ORAL_TABLET | Freq: Once | ORAL | Status: AC
  Administered 2017-02-10: 60 mg via ORAL

## 2017-02-10 MED ORDER — IPRATROPIUM-ALBUTEROL 0.5-2.5 (3) MG/3ML IN SOLN
3.0000 mL | Freq: Once | RESPIRATORY_TRACT | Status: AC
  Administered 2017-02-10: 3 mL via RESPIRATORY_TRACT

## 2017-02-10 MED ORDER — HYDROCODONE-HOMATROPINE 5-1.5 MG/5ML PO SYRP: 5.0000 mL | ORAL_SOLUTION | Freq: Four times a day (QID) | ORAL | 0 refills | Status: DC | PRN

## 2017-02-10 MED ORDER — ALBUTEROL SULFATE HFA 108 (90 BASE) MCG/ACT IN AERS: 2.0000 | INHALATION_SPRAY | RESPIRATORY_TRACT | 0 refills | Status: DC | PRN

## 2017-02-10 MED ORDER — PREDNISONE 50 MG PO TABS: ORAL_TABLET | ORAL | 0 refills | Status: DC

## 2017-02-10 MED ORDER — IPRATROPIUM-ALBUTEROL 0.5-2.5 (3) MG/3ML IN SOLN
RESPIRATORY_TRACT | Status: AC
  Filled 2017-02-10: qty 3

## 2017-02-10 NOTE — ED Provider Notes (Signed)
[facility]    CSN: [id number] Arrival date & time: 02/10/17  1159     History   Chief Complaint Chief Complaint  Patient presents with  . Cough    HPI Shatha Soundra PilonH Azeez is a 46 y.o. female.   46 year old female accompanied by husband who interprets for her. Cough for one week. Also having proximal coughing spasms. She has had no fever, chills, body aches, abdominal pain, GI symptoms, shortness of breath, rash, headache. No history of asthma or smoking.  Positive for rhinorrhea.       History reviewed. No pertinent past medical history.  There are no active problems to display for this patient.   History reviewed. No pertinent surgical history.  OB History    No data available       Home Medications    Prior to Admission medications   Medication Sig Start Date End Date Taking? Authorizing Provider  albuterol (PROVENTIL HFA;VENTOLIN HFA) 108 (90 Base) MCG/ACT inhaler Inhale 2 puffs every 4 (four) hours as needed into the lungs for wheezing or shortness of breath. 02/10/17   Hayden RasmussenMabe, Ellisa Devivo, NP  Gabapentin, Once-Daily, 300 MG TABS Take 1 tablet by mouth 2 (two) times daily. 03/17/16   Linna HoffKindl, James D, MD  HYDROcodone-homatropine (HYCODAN) 5-1.5 MG/5ML syrup Take 5 mLs every 6 (six) hours as needed by mouth for cough. May cause drowsiness 02/10/17   Hayden RasmussenMabe, Aurther Harlin, NP  Ibuprofen (ADVIL) 200 MG CAPS Take 2 capsules by mouth as needed.    [provider]  predniSONE (DELTASONE) 50 MG tablet 1 tab po daily for 6 days. Take with food. 02/10/17   Hayden RasmussenMabe, Enna Warwick, NP    Family History History reviewed. No pertinent family history.  Social History Social History   Tobacco Use  . Smoking status: Never Smoker  . Smokeless tobacco: Never Used  Substance Use Topics  . Alcohol use: No  . Drug use: No     Allergies   Patient has no known allergies.   Review of Systems Review of Systems  Constitutional: Negative for activity change, appetite change,  chills, fatigue and fever.  HENT: Positive for postnasal drip and rhinorrhea. Negative for congestion and facial swelling.   Eyes: Negative.   Respiratory: Positive for cough.   Cardiovascular: Negative.   Musculoskeletal: Negative for neck pain and neck stiffness.  Skin: Negative for pallor and rash.  Neurological: Negative.      Physical Exam Triage Vital Signs ED Triage Vitals [02/10/17 1219]  Enc Vitals Group     BP 118/79     Pulse Rate (!) 102     Resp (!) 22     Temp 98.5 F (36.9 C)     Temp src      SpO2 97 %     Weight      Height      Head Circumference      Peak Flow      Pain Score      Pain Loc      Pain Edu?      Excl. in GC?    No data found.  Updated Vital Signs BP 118/79   Pulse (!) 102   Temp 98.5 F (36.9 C)   Resp (!) 22   SpO2 97%   Visual Acuity Right Eye Distance:   Left Eye Distance:   Bilateral Distance:    Right Eye Near:   Left Eye Near:    Bilateral Near:     Physical Exam  Constitutional: She appears well-developed and well-nourished. No distress.  HENT:  Oropharynx with minimal erythema and positive for moderate amount of clear PND.  Neck: Normal range of motion. Neck supple.  Cardiovascular: Normal rate and regular rhythm.  Pulmonary/Chest: Effort normal. No respiratory distress.  With forced expiration bilateral coarseness and wheeze. Fair to good air movement. No crackles.  Musculoskeletal: Normal range of motion. She exhibits no edema.  Lymphadenopathy:    She has no cervical adenopathy.  Neurological: She is alert.  Skin: Skin is warm and dry. No rash noted.  Psychiatric: She has a normal mood and affect.  Nursing note and vitals reviewed.    UC Treatments / Results  Labs (all labs ordered are listed, but only abnormal results are displayed) Labs Reviewed - No data to display  EKG  EKG Interpretation None       Radiology No results found.  Procedures Procedures (including critical care  time)  Medications Ordered in UC Medications  ipratropium-albuterol (DUONEB) 0.5-2.5 (3) MG/3ML nebulizer solution 3 mL (not administered)  predniSONE (DELTASONE) tablet 60 mg (not administered)     Initial Impression / Assessment and Plan / UC Course  I have reviewed the triage vital signs and the nursing notes.  Pertinent labs & imaging results that were available during my care of the patient were reviewed by me and considered in my medical decision making (see chart for details).    Take medication as directed. Follow instructions. The cough medicine can cause drowsiness. The cough is primarily due to bronchospasm and the best medicine to use is the hand-held inhaler.   Post DuoNeb patient is feeling much better, less cough, lungs are now clear. Instructions given.  Final Clinical Impressions(s) / UC Diagnoses   Final diagnoses:  Cough  Bronchospasm  Acute upper respiratory infection    ED Discharge Orders        Ordered    albuterol (PROVENTIL HFA;VENTOLIN HFA) 108 (90 Base) MCG/ACT inhaler  Every 4 hours PRN     02/10/17 1259    predniSONE (DELTASONE) 50 MG tablet     02/10/17 1259    HYDROcodone-homatropine (HYCODAN) 5-1.5 MG/5ML syrup  Every 6 hours PRN     02/10/17 1259       Controlled Substance Prescriptions [place] Controlled Substance Registry consulted? No   Hayden RasmussenMabe, Kymia Simi, NP 02/10/17 1338

## 2017-02-10 NOTE — Discharge Instructions (Addendum)
Take medication as directed. Follow instructions. The cough medicine can cause drowsiness. The cough is primarily due to bronchospasm and the best medicine to use is the hand-held inhaler.

## 2017-02-10 NOTE — ED Triage Notes (Signed)
Pt here for cough x 1 week. Denies fever.

## 2017-02-18 ENCOUNTER — Ambulatory Visit (INDEPENDENT_AMBULATORY_CARE_PROVIDER_SITE_OTHER): Payer: Federal, State, Local not specified - PPO

## 2017-02-18 ENCOUNTER — Ambulatory Visit (HOSPITAL_COMMUNITY)
Admission: EM | Admit: 2017-02-18 | Discharge: 2017-02-18 | Disposition: A | Discharge status: Home / Self Care | Payer: Federal, State, Local not specified - PPO | Attending: Family Medicine | Admitting: Family Medicine

## 2017-02-18 ENCOUNTER — Encounter (HOSPITAL_COMMUNITY): Payer: Self-pay | Admitting: Emergency Medicine

## 2017-02-18 ENCOUNTER — Other Ambulatory Visit: Payer: Self-pay

## 2017-02-18 DIAGNOSIS — R05 Cough: Secondary | ICD-10-CM | POA: Diagnosis not present

## 2017-02-18 DIAGNOSIS — R059 Cough, unspecified: Secondary | ICD-10-CM

## 2017-02-18 MED ORDER — PREDNISONE 10 MG (21) PO TBPK: ORAL_TABLET | Freq: Every day | ORAL | 0 refills | Status: DC

## 2017-02-18 MED ORDER — AZITHROMYCIN 250 MG PO TABS: ORAL_TABLET | ORAL | 0 refills | Status: DC

## 2017-02-18 NOTE — ED Provider Notes (Signed)
[facility]    CSN: [id number] Arrival date & time: 02/18/17  1204     History   Chief Complaint Chief Complaint  Patient presents with  . Cough    HPI Jennifer Shannon is a 46 y.o. female.   Jennifer Shannon presents with her husband with complaints of persistent cough. It started approximately 3 weeks ago. She was seen and provided with albuterol inhaler, night time cough syrup and prenisone burst on 11/7. The medications helped some but cough persists. Without fever or runny nose. No history of asthma, dose not smoke, denies any previous similar illness. Does complain of headache. Denies gi/gu complaints. No known ill contacts. Denies ear or throat pain.   ROS per HPI.       History reviewed. No pertinent past medical history.  There are no active problems to display for this patient.   History reviewed. No pertinent surgical history.  OB History    No data available       Home Medications    Prior to Admission medications   Medication Sig Start Date End Date Taking? Authorizing Provider  albuterol (PROVENTIL HFA;VENTOLIN HFA) 108 (90 Base) MCG/ACT inhaler Inhale 2 puffs every 4 (four) hours as needed into the lungs for wheezing or shortness of breath. 02/10/17   Jennifer Shannon, David, NP  azithromycin (ZITHROMAX) 250 MG tablet Take first 2 tablets together, then 1 every day until finished. 02/18/17   Jennifer Shannon, Jennifer B, NP  Gabapentin, Once-Daily, 300 MG TABS Take 1 tablet by mouth 2 (two) times daily. 03/17/16   Jennifer Shannon, Jennifer D, MD  HYDROcodone-homatropine (HYCODAN) 5-1.5 MG/5ML syrup Take 5 mLs every 6 (six) hours as needed by mouth for cough. May cause drowsiness 02/10/17   Jennifer Shannon, David, NP  Ibuprofen (ADVIL) 200 MG CAPS Take 2 capsules by mouth as needed.    [provider]  predniSONE (STERAPRED UNI-PAK 21 TAB) 10 MG (21) TBPK tablet Take by mouth daily. Take 6 tabs by mouth daily  for 2 days, then 5 tabs for 2 days, then 4 tabs for 2 days, then 3 tabs for 2  days, 2 tabs for 2 days, then 1 tab by mouth daily for 2 days 02/18/17   Jennifer Shannon, Jennifer B, NP    Family History History reviewed. No pertinent family history.  Social History Social History   Tobacco Use  . Smoking status: Never Smoker  . Smokeless tobacco: Never Used  Substance Use Topics  . Alcohol use: No  . Drug use: No     Allergies   Patient has no known allergies.   Review of Systems Review of Systems   Physical Exam Triage Vital Signs ED Triage Vitals [02/18/17 1228]  Enc Vitals Group     BP (!) 115/94     Pulse Rate 93     Resp 18     Temp (!) 97.4 F (36.3 C)     Temp src      SpO2 100 %     Weight      Height      Head Circumference      Peak Flow      Pain Score      Pain Loc      Pain Edu?      Excl. in GC?    No data found.  Updated Vital Signs BP (!) 115/94   Pulse 93   Temp (!) 97.4 F (36.3 C)   Resp 18   LMP 02/01/2017 (Approximate)  SpO2 100%   Visual Acuity Right Eye Distance:   Left Eye Distance:   Bilateral Distance:    Right Eye Near:   Left Eye Near:    Bilateral Near:     Physical Exam  Constitutional: She is oriented to person, place, and time. She appears well-developed and well-nourished. No distress.  HENT:  Head: Normocephalic and atraumatic.  Right Ear: Tympanic membrane, external ear and ear canal normal.  Left Ear: Tympanic membrane, external ear and ear canal normal.  Nose: Nose normal.  Mouth/Throat: Uvula is midline, oropharynx is clear and moist and mucous membranes are normal. Tonsils are 2+ on the right. Tonsils are 2+ on the left. No tonsillar exudate.  epithelization to left tonsil noted  Eyes: Conjunctivae and EOM are normal. Pupils are equal, round, and reactive to light.  Cardiovascular: Normal rate, regular rhythm and normal heart sounds.  Pulmonary/Chest: Effort normal and breath sounds normal. She has no wheezes.  Frequent dry cough noted; lungs clear  Lymphadenopathy:    She has no  cervical adenopathy.  Neurological: She is alert and oriented to person, place, and time.  Skin: Skin is warm and dry.     UC Treatments / Results  Labs (all labs ordered are listed, but only abnormal results are displayed) Labs Reviewed - No data to display  EKG  EKG Interpretation None       Radiology Dg Chest 2 View  Result Date: 02/18/2017 CLINICAL DATA:  46 year old female with cough for 2 weeks. EXAM: CHEST  2 VIEW COMPARISON:  11/25/2014 FINDINGS: The heart size and mediastinal contours are within normal limits. Both lungs are clear. The visualized skeletal structures are unremarkable. IMPRESSION: No active cardiopulmonary disease. Electronically Signed   By: Sande BrothersSerena  Chacko M.Shannon.   On: 02/18/2017 13:13    Procedures Procedures (including critical care time)  Medications Ordered in UC Medications - No data to display   Initial Impression / Assessment and Plan / UC Course  I have reviewed the triage vital signs and the nursing notes.  Pertinent labs & imaging results that were available during my care of the patient were reviewed by me and considered in my medical decision making (see chart for details).    Non toxic non distressed in appearance, vitals stable today. Frequent and obvious persistent dry cough however. Lungs clear on chest xray today. Additional course of prednisone at this time, as well as course of azithromycin today. Continue with inhaler if needed, as well as previously prescribed cough syrup. Recommended follow up with PCP for recheck in the next 1-2 weeks. Patient verbalized understanding and agreeable to plan.      Final Clinical Impressions(s) / UC Diagnoses   Final diagnoses:  Cough    ED Discharge Orders        Ordered    predniSONE (STERAPRED UNI-PAK 21 TAB) 10 MG (21) TBPK tablet  Daily     02/18/17 1318    azithromycin (ZITHROMAX) 250 MG tablet     02/18/17 1319       Controlled Substance Prescriptions [place] Controlled Substance  Registry consulted? Not Applicable   Jennifer Shannon, Jennifer B, NP 02/18/17 1324

## 2017-02-18 NOTE — ED Triage Notes (Signed)
Pt seen here a week ago for cough and treated, c/o continuing cough.

## 2017-05-07 ENCOUNTER — Ambulatory Visit (HOSPITAL_COMMUNITY)
Admission: EM | Admit: 2017-05-07 | Discharge: 2017-05-07 | Disposition: A | Discharge status: Home / Self Care | Payer: Federal, State, Local not specified - PPO | Attending: Family Medicine | Admitting: Family Medicine

## 2017-05-07 ENCOUNTER — Encounter (HOSPITAL_COMMUNITY): Payer: Self-pay | Admitting: Emergency Medicine

## 2017-05-07 DIAGNOSIS — J069 Acute upper respiratory infection, unspecified: Secondary | ICD-10-CM

## 2017-05-07 DIAGNOSIS — J039 Acute tonsillitis, unspecified: Secondary | ICD-10-CM

## 2017-05-07 MED ORDER — AMOXICILLIN-POT CLAVULANATE 875-125 MG PO TABS: 1.0000 | ORAL_TABLET | Freq: Two times a day (BID) | ORAL | 0 refills | Status: AC

## 2017-05-07 MED ORDER — IBUPROFEN 800 MG PO TABS: 800.0000 mg | ORAL_TABLET | Freq: Three times a day (TID) | ORAL | 0 refills | Status: DC

## 2017-05-07 NOTE — ED Provider Notes (Signed)
[facility]    CSN: [id number] Arrival date & time: 05/07/17  1002     History   Chief Complaint Chief Complaint  Patient presents with  . URI    HPI Jennifer Soundra PilonH Myrick is a 47 y.o. female.   Jennifer Shannon presents with family with complaints of cough, congestion, runny nose, sore throat and chest and back pain related to coughing. No known fevers. Cough is non productive. Cough causes her to feel short of breath. No known fevers. No known ill contacts. Does not smoke. Does not take any medications regularly. Had similar cough November 2018 which improved with zpack and prednisone. Without skin rash.    ROS per HPI.       History reviewed. No pertinent past medical history.  There are no active problems to display for this patient.   History reviewed. No pertinent surgical history.  OB History    No data available       Home Medications    Prior to Admission medications   Medication Sig Start Date End Date Taking? Authorizing Provider  albuterol (PROVENTIL HFA;VENTOLIN HFA) 108 (90 Base) MCG/ACT inhaler Inhale 2 puffs every 4 (four) hours as needed into the lungs for wheezing or shortness of breath. 02/10/17   Hayden RasmussenMabe, David, NP  azithromycin (ZITHROMAX) 250 MG tablet Take first 2 tablets together, then 1 every day until finished. 02/18/17   Linus MakoBurky, Seana Underwood B, NP  Gabapentin, Once-Daily, 300 MG TABS Take 1 tablet by mouth 2 (two) times daily. 03/17/16   Linna HoffKindl, James D, MD  HYDROcodone-homatropine (HYCODAN) 5-1.5 MG/5ML syrup Take 5 mLs every 6 (six) hours as needed by mouth for cough. May cause drowsiness 02/10/17   Hayden RasmussenMabe, David, NP  Ibuprofen (ADVIL) 200 MG CAPS Take 2 capsules by mouth as needed.    [provider]  predniSONE (STERAPRED UNI-PAK 21 TAB) 10 MG (21) TBPK tablet Take by mouth daily. Take 6 tabs by mouth daily  for 2 days, then 5 tabs for 2 days, then 4 tabs for 2 days, then 3 tabs for 2 days, 2 tabs for 2 days, then 1 tab by mouth daily for 2  days 02/18/17   Georgetta HaberBurky, Merel Santoli B, NP    Family History History reviewed. No pertinent family history.  Social History Social History   Tobacco Use  . Smoking status: Never Smoker  . Smokeless tobacco: Never Used  Substance Use Topics  . Alcohol use: No  . Drug use: No     Allergies   Patient has no known allergies.   Review of Systems Review of Systems   Physical Exam Triage Vital Signs ED Triage Vitals  Enc Vitals Group     BP 05/07/17 1015 123/77     Pulse Rate 05/07/17 1015 90     Resp 05/07/17 1015 16     Temp 05/07/17 1015 98.4 F (36.9 C)     Temp Source 05/07/17 1015 Oral     SpO2 05/07/17 1015 100 %     Weight 05/07/17 1014 140 lb (63.5 kg)     Height --      Head Circumference --      Peak Flow --      Pain Score --      Pain Loc --      Pain Edu? --      Excl. in GC? --    No data found.  Updated Vital Signs BP 123/77   Pulse 90   Temp 98.4 F (36.9  C) (Oral)   Resp 16   Wt 140 lb (63.5 kg)   SpO2 100%   BMI 23.66 kg/m   Visual Acuity Right Eye Distance:   Left Eye Distance:   Bilateral Distance:    Right Eye Near:   Left Eye Near:    Bilateral Near:     Physical Exam  Constitutional: She is oriented to person, place, and time. She appears well-developed and well-nourished. No distress.  HENT:  Head: Normocephalic and atraumatic.  Right Ear: Tympanic membrane, external ear and ear canal normal.  Left Ear: Tympanic membrane, external ear and ear canal normal.  Nose: Rhinorrhea present.  Mouth/Throat: Uvula is midline, oropharynx is clear and moist and mucous membranes are normal. Tonsils are 1+ on the right. Tonsils are 2+ on the left. No tonsillar exudate.  L tonsil with noticeable enlargement, greater than right; without exudate  Eyes: Conjunctivae and EOM are normal. Pupils are equal, round, and reactive to light.  Cardiovascular: Normal rate, regular rhythm and normal heart sounds.  Pulmonary/Chest: Effort normal and breath  sounds normal.  Frequent strong congested cough noted  Lymphadenopathy:    She has no cervical adenopathy.  Neurological: She is alert and oriented to person, place, and time.  Skin: Skin is warm and dry.     UC Treatments / Results  Labs (all labs ordered are listed, but only abnormal results are displayed) Labs Reviewed - No data to display  EKG  EKG Interpretation None       Radiology No results found.  Procedures Procedures (including critical care time)  Medications Ordered in UC Medications - No data to display   Initial Impression / Assessment and Plan / UC Course  I have reviewed the triage vital signs and the nursing notes.  Pertinent labs & imaging results that were available during my care of the patient were reviewed by me and considered in my medical decision making (see chart for details).     Significant cough and tonsillar edema present, symptoms for approximately 1 week. Augmentin initiated. Push fluids to ensure adequate hydration and keep secretions thin.  Tylenol and/or ibuprofen as needed for pain or fevers.  If symptoms worsen or do not improve in the next week to return to be seen or to follow up with PCP.  Patient verbalized understanding and agreeable to plan.    Final Clinical Impressions(s) / UC Diagnoses   Final diagnoses:  Viral upper respiratory tract infection  Tonsillitis    ED Discharge Orders    None       Controlled Substance Prescriptions [place] Controlled Substance Registry consulted? Not Applicable   Georgetta Haber, NP 05/07/17 1039

## 2017-05-07 NOTE — Discharge Instructions (Signed)
Push fluids to ensure adequate hydration and keep secretions thin.  °Tylenol and/or ibuprofen as needed for pain or fevers.  °Complete course of antibiotics.  °If symptoms worsen or do not improve in the next week to return to be seen or to follow up with PCP.   °

## 2017-05-07 NOTE — ED Triage Notes (Signed)
PT reports cough for 5 days. Nonproductive.

## 2017-05-27 ENCOUNTER — Other Ambulatory Visit: Payer: Self-pay

## 2017-05-27 ENCOUNTER — Other Ambulatory Visit: Payer: Self-pay | Admitting: Urgent Care

## 2017-05-27 ENCOUNTER — Encounter (HOSPITAL_COMMUNITY): Payer: Self-pay | Admitting: *Deleted

## 2017-05-27 ENCOUNTER — Ambulatory Visit (HOSPITAL_COMMUNITY)
Admission: EM | Admit: 2017-05-27 | Discharge: 2017-05-27 | Disposition: A | Discharge status: Home / Self Care | Payer: Federal, State, Local not specified - PPO | Attending: Internal Medicine | Admitting: Internal Medicine

## 2017-05-27 DIAGNOSIS — J358 Other chronic diseases of tonsils and adenoids: Secondary | ICD-10-CM

## 2017-05-27 DIAGNOSIS — J029 Acute pharyngitis, unspecified: Secondary | ICD-10-CM

## 2017-05-27 DIAGNOSIS — R0789 Other chest pain: Secondary | ICD-10-CM

## 2017-05-27 LAB — POCT I-STAT, CHEM 8
BUN: 5 mg/dL — AB (ref 6–20)
CALCIUM ION: 1.21 mmol/L (ref 1.15–1.40)
CHLORIDE: 104 mmol/L (ref 101–111)
Creatinine, Ser: 0.6 mg/dL (ref 0.44–1.00)
GLUCOSE: 90 mg/dL (ref 65–99)
HCT: 41 % (ref 36.0–46.0)
Hemoglobin: 13.9 g/dL (ref 12.0–15.0)
Potassium: 3.7 mmol/L (ref 3.5–5.1)
Sodium: 141 mmol/L (ref 135–145)
TCO2: 25 mmol/L (ref 22–32)

## 2017-05-27 MED ORDER — OMEPRAZOLE 20 MG PO CPDR: 20.0000 mg | DELAYED_RELEASE_CAPSULE | Freq: Every day | ORAL | 1 refills | Status: DC

## 2017-05-27 MED ORDER — CLINDAMYCIN HCL 300 MG PO CAPS: 300.0000 mg | ORAL_CAPSULE | Freq: Three times a day (TID) | ORAL | 0 refills | Status: DC

## 2017-05-27 NOTE — ED Triage Notes (Addendum)
Heart burn that's causing tightness in center of chest that radiates to back, sore throat,

## 2017-05-27 NOTE — ED Provider Notes (Signed)
  MRN: [id number] DOB: 12/09/1970  Subjective:   Jennifer Shannon is a 47 y.o. female presenting for 1 day history of mid-sternal chest pain that radiates toward her back and has associated nausea with vomiting (3 episodes). Has tried an otc heartburn medication with some relief. Denies abdominal pain, jaw pain, neck pain, limb pain, cough, diaphoresis. Denies smoking cigarettes. Last OV here was 05/07/2017, was treated with Augmentin. She has finished course of antibiotics ~1 week ago. Still has sore throat. Denies diarrhea, bloody stools.   Jennifer Shannon is not currently taking any medications and has No Known Allergies.  Jennifer Shannon denies past medical and surgical history.   Objective:   Vitals: BP 131/72 (BP Location: Left Arm)   Pulse 85   Temp 98.1 F (36.7 C)   LMP 05/24/2017 (Approximate)   SpO2 100%   Physical Exam  Constitutional: She is oriented to person, place, and time. She appears well-developed and well-nourished.  HENT:  Left sided tonsillar erythema with exudate, swelling.  Eyes: Right eye exhibits no discharge. Left eye exhibits no discharge.  Neck: Normal range of motion. Neck supple.  Cardiovascular: Normal rate, regular rhythm and intact distal pulses. Exam reveals no gallop and no friction rub.  No murmur heard. Pulmonary/Chest: No respiratory distress. She has no wheezes. She has no rales.  Neurological: She is alert and oriented to person, place, and time.  Skin: Skin is warm and dry.  Psychiatric: She has a normal mood and affect.   Results for orders placed or performed during the hospital encounter of 05/27/17 (from the past 24 hour(s))  I-STAT, chem 8     Status: Abnormal   Collection Time: 05/27/17  2:00 PM  Result Value Ref Range   Sodium 141 135 - 145 mmol/L   Potassium 3.7 3.5 - 5.1 mmol/L   Chloride 104 101 - 111 mmol/L   BUN 5 (L) 6 - 20 mg/dL   Creatinine, Ser 8.650.60 0.44 - 1.00 mg/dL   Glucose, Bld 90 65 - 99 mg/dL   Calcium, Ion 7.841.21 6.961.15 - 1.40 mmol/L   TCO2 25 22 - 32 mmol/L   Hemoglobin 13.9 12.0 - 15.0 g/dL   HCT 29.541.0 28.436.0 - 13.246.0 %   ED ECG REPORT   Date: 05/27/2017  Rate: 79bpm  Rhythm: normal sinus rhythm  QRS Axis: normal  Intervals: normal  ST/T Wave abnormalities: normal  Conduction Disutrbances:none  Narrative Interpretation: Normal sinus rhythm without acute findings, no ecg for comparison.  Old EKG Reviewed: none available  I have personally reviewed the EKG tracing and agree with the computerized printout as noted.   Assessment and Plan :   Atypical chest pain  Tonsillar exudate  Acute pharyngitis, unspecified etiology  Physical exam findings concerning for persistent pharyngitis. Will start on clindamycin, recommended she set up an appointment with their ENT physician. Start Prilosec for GERD. Return-to-clinic precautions discussed, patient verbalized understanding.    Wallis BambergMani, Tanga Gloor, PA-C 05/27/17 [id number]

## 2017-05-27 NOTE — Discharge Instructions (Signed)
Take your antibiotic with food and ask the pharmacist for recommendations on a probiotic. Please make sure you set up an appointment with your Ear, Nose, Throat specialist.

## 2017-05-28 LAB — POCT H PYLORI SCREEN: H. PYLORI SCREEN, POC: NEGATIVE

## 2019-04-09 ENCOUNTER — Ambulatory Visit: Payer: Self-pay | Attending: Internal Medicine

## 2019-04-09 DIAGNOSIS — Z20822 Contact with and (suspected) exposure to covid-19: Secondary | ICD-10-CM

## 2019-04-10 LAB — NOVEL CORONAVIRUS, NAA: SARS-CoV-2, NAA: DETECTED — AB

## 2020-07-27 ENCOUNTER — Ambulatory Visit (HOSPITAL_COMMUNITY)
Admission: EM | Admit: 2020-07-27 | Discharge: 2020-07-27 | Disposition: A | Discharge status: Home / Self Care | Payer: Federal, State, Local not specified - PPO | Attending: Internal Medicine | Admitting: Internal Medicine

## 2020-07-27 ENCOUNTER — Encounter (HOSPITAL_COMMUNITY): Payer: Self-pay | Admitting: Emergency Medicine

## 2020-07-27 ENCOUNTER — Other Ambulatory Visit: Payer: Self-pay

## 2020-07-27 ENCOUNTER — Ambulatory Visit (INDEPENDENT_AMBULATORY_CARE_PROVIDER_SITE_OTHER): Payer: Federal, State, Local not specified - PPO

## 2020-07-27 DIAGNOSIS — W19XXXA Unspecified fall, initial encounter: Secondary | ICD-10-CM | POA: Diagnosis not present

## 2020-07-27 DIAGNOSIS — G8929 Other chronic pain: Secondary | ICD-10-CM | POA: Diagnosis not present

## 2020-07-27 DIAGNOSIS — M25562 Pain in left knee: Secondary | ICD-10-CM | POA: Diagnosis not present

## 2020-07-27 DIAGNOSIS — Y92009 Unspecified place in unspecified non-institutional (private) residence as the place of occurrence of the external cause: Secondary | ICD-10-CM

## 2020-07-27 DIAGNOSIS — R519 Headache, unspecified: Secondary | ICD-10-CM | POA: Diagnosis not present

## 2020-07-27 DIAGNOSIS — M899 Disorder of bone, unspecified: Secondary | ICD-10-CM | POA: Diagnosis not present

## 2020-07-27 MED ORDER — NAPROXEN 500 MG PO TABS: 500.0000 mg | ORAL_TABLET | Freq: Two times a day (BID) | ORAL | 0 refills | Status: AC | PRN

## 2020-07-27 NOTE — ED Provider Notes (Signed)
[facility]    CSN: [id number] Arrival date & time: 07/27/20  0930      History   Chief Complaint Chief Complaint  Patient presents with  . Fall  . Headache    HPI Jennifer Shannon is a 50 y.o. female.   50 year old female accompanied by a caregiver who is helping to interpret. Declines video interpretation. She presents with left knee pain. She fell down her stairs inside her home last week and slid and landed on her left side- mainly on her left hip and knee. She is experiencing mostly left knee pain with some radiation up to her left hip. She is able to ambulate but has been limping more in the past few days. She denies any dizziness, change in vision, chest pain, difficulty breathing or GI symptoms. She does have a history of recurrent chronic headaches that she has had for the past few years. Usually bilateral headaches around temporal area 2 times a week. Denies any change in vision, nausea or vomiting with headaches. Occurs mainly in evening. Takes Tylenol as needed with some relief. No current PCP. Caregiver requesting routine lab work and evaluation of chronic headaches as well. No other chronic health issues. Takes no daily medication except Tylenol as needed mentioned above. No pertinent family history.   The history is provided by the patient and a caregiver. The history is limited by a language barrier. A language interpreter was used (Caregiver is providing some language interpretation for patient. ).    History reviewed. No pertinent past medical history.  There are no problems to display for this patient.   History reviewed. No pertinent surgical history.  OB History   No obstetric history on file.      Home Medications    Prior to Admission medications   Medication Sig Start Date End Date Taking? Authorizing Provider  naproxen (NAPROSYN) 500 MG tablet Take 1 tablet (500 mg total) by mouth every 12 (twelve) hours as needed for moderate pain or  headache. 07/27/20  Yes Tanishia Lemaster, Ali Lowe, NP  albuterol (PROVENTIL HFA;VENTOLIN HFA) 108 (90 Base) MCG/ACT inhaler Inhale 2 puffs every 4 (four) hours as needed into the lungs for wheezing or shortness of breath. 02/10/17 07/27/20  Hayden Rasmussen, NP  omeprazole (PRILOSEC) 20 MG capsule Take 1 capsule (20 mg total) by mouth daily. 05/27/17 07/27/20  Wallis Bamberg, PA-C    Family History History reviewed. No pertinent family history.  Social History Social History   Tobacco Use  . Smoking status: Never Smoker  . Smokeless tobacco: Never Used  Vaping Use  . Vaping Use: Never used  Substance Use Topics  . Alcohol use: No  . Drug use: No     Allergies   Patient has no known allergies.   Review of Systems Review of Systems  Constitutional: Negative for appetite change, chills, diaphoresis and fever.  HENT: Negative for congestion, ear pain, facial swelling, hearing loss, mouth sores, nosebleeds, rhinorrhea, sinus pressure, sinus pain, sore throat, tinnitus and trouble swallowing.   Eyes: Negative for photophobia and visual disturbance.  Respiratory: Negative for chest tightness, shortness of breath and wheezing.   Cardiovascular: Negative for chest pain.  Gastrointestinal: Negative for nausea and vomiting.  Genitourinary: Negative for difficulty urinating and flank pain.  Musculoskeletal: Positive for arthralgias, gait problem and myalgias. Negative for joint swelling.  Skin: Positive for color change. Negative for rash and wound.  Allergic/Immunologic: Negative for environmental allergies, food allergies and immunocompromised state.  Neurological:  Positive for headaches. Negative for dizziness, tremors, seizures, syncope, facial asymmetry, speech difficulty, weakness, light-headedness and numbness.  Hematological: Negative for adenopathy. Does not bruise/bleed easily.     Physical Exam Triage Vital Signs ED Triage Vitals  Enc Vitals Group     BP 07/27/20 1039 129/80     Pulse Rate  07/27/20 1039 84     Resp 07/27/20 1039 17     Temp 07/27/20 1039 98.7 F (37.1 C)     Temp Source 07/27/20 1039 Oral     SpO2 07/27/20 1039 98 %     Weight --      Height --      Head Circumference --      Peak Flow --      Pain Score 07/27/20 1037 4     Pain Loc --      Pain Edu? --      Excl. in GC? --    No data found.  Updated Vital Signs BP 129/80 (BP Location: Right Arm)   Pulse 84   Temp 98.7 F (37.1 C) (Oral)   Resp 17   SpO2 98%   Visual Acuity Right Eye Distance:   Left Eye Distance:   Bilateral Distance:    Right Eye Near:   Left Eye Near:    Bilateral Near:     Physical Exam Vitals and nursing note reviewed.  Constitutional:      General: She is awake. She is not in acute distress.    Appearance: She is well-developed and well-groomed. She is not ill-appearing.     Comments: She is sitting on the exam table in no acute distress but appears uncomfortable due to pain along her left leg and knee.   HENT:     Head: Normocephalic and atraumatic.     Right Ear: Hearing normal.     Left Ear: Hearing normal.     Nose: Nose normal.  Eyes:     Extraocular Movements: Extraocular movements intact.     Conjunctiva/sclera: Conjunctivae normal.     Pupils: Pupils are equal, round, and reactive to light.  Cardiovascular:     Rate and Rhythm: Normal rate and regular rhythm.     Heart sounds: Normal heart sounds. No murmur heard.   Pulmonary:     Effort: Pulmonary effort is normal. No respiratory distress.     Breath sounds: Normal breath sounds and air entry. No decreased air movement. No decreased breath sounds, wheezing, rhonchi or rales.  Musculoskeletal:        General: Tenderness present. No swelling.     Cervical back: Normal range of motion.     Right knee: Normal.     Left knee: Ecchymosis present. No swelling, effusion, erythema or bony tenderness. Normal range of motion. Tenderness present over the medial joint line and lateral joint line. No  patellar tendon tenderness. Normal alignment, normal meniscus and normal patellar mobility. Normal pulse.     Right lower leg: No edema.     Left lower leg: No edema.       Legs:     Comments: Small dark purple bruise present just below patella on left knee. Has full range of motion of left leg and knee. No distant swelling. Some tenderness mainly along lateral aspect of patella and joint line but slight tenderness along medial joint line as well.  Good pulses and capillary refill. No neuro deficits noted.   Skin:    General: Skin is warm and dry.  Capillary Refill: Capillary refill takes less than 2 seconds.     Findings: Ecchymosis (left knee) present. No erythema.     Comments: No bruising of leg or hip.   Neurological:     General: No focal deficit present.     Mental Status: She is alert and oriented to person, place, and time.     Cranial Nerves: Cranial nerves are intact.     Sensory: Sensation is intact. No sensory deficit.     Motor: Motor function is intact.     Deep Tendon Reflexes: Reflexes are normal and symmetric.  Psychiatric:        Mood and Affect: Mood normal.        Behavior: Behavior normal. Behavior is cooperative.        Thought Content: Thought content normal.        Judgment: Judgment normal.      UC Treatments / Results  Labs (all labs ordered are listed, but only abnormal results are displayed) Labs Reviewed - No data to display  EKG   Radiology DG Knee Complete 4 Views Left  Result Date: 07/27/2020 CLINICAL DATA:  Pain following fall EXAM: LEFT KNEE - COMPLETE 4+ VIEW COMPARISON:  None. FINDINGS: Frontal, lateral, and bilateral oblique views obtained. No fracture or dislocation. No joint effusion. Joint spaces appear unremarkable. No erosive change. Benign-appearing sclerotic focus noted in the lateral distal femur measuring 1.6 x 1.6 cm. IMPRESSION: Benign-appearing sclerotic focus in the lateral distal femur measuring 1.6 x 1.6 cm. It may be  prudent to consider follow-up study to assess for stability in approximately 3 months. No fracture, dislocation, or joint effusion. No appreciable arthropathic change. These results will be called to the ordering clinician or representative by the Radiologist Assistant, and communication documented in the PACS or [employer] Dashboard. Electronically Signed   By: Bretta BangWilliam  Woodruff III M.D.   On: 07/27/2020 11:44    Procedures Procedures (including critical care time)  Medications Ordered in UC Medications - No data to display  Initial Impression / Assessment and Plan / UC Course  I have reviewed the triage vital signs and the nursing notes.  Pertinent labs & imaging results that were available during my care of the patient were reviewed by me and considered in my medical decision making (see chart for details).    Reviewed x-ray results with patient and caregiver. No fracture or dislocation of knee. Briefly discussed finding of benign-appearing sclerotic focus in the left lateral distal femur- uncertain of cause/etiology- recommend repeat x-ray in 3 months to re-evaluation. Discussed that knee pain probably due to ligament and muscle strain due to fall. May trial Naproxen 500mg  twice a day as needed. May also use this medication for headaches. Apply warm compresses to knee for comfort. Caregiver's father has seen Orthopedic, Dr. Ophelia CharterYates, and requests information for patient to make an appointment with Dr. Ophelia CharterYates for further evaluation of knee pain and abnormality detected on femur on x-ray.  Discussed that further evaluation of chronic headaches is not appropriate for Urgent Care and needs to be evaluated by a PCP or Neurologist. Also routine lab work not performed at Urgent Care- again, best done with a PCP so appropriate follow-up can occur. [facility] to establish care with a PCP for further evaluation and any routine lab work.  Final Clinical  Impressions(s) / UC Diagnoses   Final diagnoses:  Left lateral knee pain  Fall in home, initial encounter  Chronic intractable headache,  unspecified headache type  Lesion of left femur     Discharge Instructions     Recommend start Naproxen 500mg  twice a day as needed for headache and left knee pain. Continue to elevate knee to help with pain. May also apply warm compresses to area for comfort. Recommend contact Dr. , Orthopedic, for further evaluation as well as contact [facility] to establish care with a PCP and for further evaluation and management of chronic headaches.     ED Prescriptions    Medication Sig Dispense Auth. Provider   naproxen (NAPROSYN) 500 MG tablet Take 1 tablet (500 mg total) by mouth every 12 (twelve) hours as needed for moderate pain or headache. 30 tablet Kjirsten Bloodgood, Ophelia Charter, NP     PDMP not reviewed this encounter.   Ali Lowe, NP 07/28/20 1112

## 2020-07-27 NOTE — Discharge Instructions (Addendum)
Recommend start Naproxen 500mg  twice a day as needed for headache and left knee pain. Continue to elevate knee to help with pain. May also apply warm compresses to area for comfort. Recommend contact Dr. , Orthopedic, for further evaluation as well as contact [facility] to establish care with a PCP and for further evaluation and management of chronic headaches.

## 2020-07-27 NOTE — ED Triage Notes (Signed)
Pt presents with headaches and fall. States fell down steps 3 days ago. States having left hip and knee pain since fall.

## 2022-12-15 ENCOUNTER — Ambulatory Visit
Admission: EM | Admit: 2022-12-15 | Discharge: 2022-12-15 | Disposition: A | Discharge status: Home / Self Care | Payer: Federal, State, Local not specified - PPO | Attending: Nurse Practitioner | Admitting: Nurse Practitioner

## 2022-12-15 DIAGNOSIS — J029 Acute pharyngitis, unspecified: Secondary | ICD-10-CM | POA: Diagnosis not present

## 2022-12-15 DIAGNOSIS — R5383 Other fatigue: Secondary | ICD-10-CM

## 2022-12-15 LAB — POCT RAPID STREP A (OFFICE): Rapid Strep A Screen: NEGATIVE

## 2022-12-15 NOTE — ED Triage Notes (Signed)
Pt reports sore throat and fatigue x 2 days.

## 2022-12-15 NOTE — Discharge Instructions (Addendum)
Lab results available via mychart in 1 - 5 days Follow up with PCP

## 2022-12-15 NOTE — ED Provider Notes (Signed)
[facility]    CSN: [id number] Arrival date & time: 12/15/22  4098      History   Chief Complaint Chief Complaint  Patient presents with   Sore Throat   Fatigue         HPI Jennifer Shannon is a 52 y.o. female.   Patient here concerned with fatigue x months, worse last few weeks.  No changes in activity, no changes in diet.  No abnormal bleeding.  Perimenopausal.    Also with sore throat x 2 days.  Denies f/c, URI sx, coughing, wheezing, SOB, n/v/d.  No sick contacts.  She hasn't taken anything for this.    History reviewed. No pertinent past medical history.  There are no problems to display for this patient.   History reviewed. No pertinent surgical history.  OB History   No obstetric history on file.      Home Medications    Prior to Admission medications   Medication Sig Start Date End Date Taking? Authorizing Provider  naproxen (NAPROSYN) 500 MG tablet Take 1 tablet (500 mg total) by mouth every 12 (twelve) hours as needed for moderate pain or headache. 07/27/20   Sudie Grumbling, NP  albuterol (PROVENTIL HFA;VENTOLIN HFA) 108 (90 Base) MCG/ACT inhaler Inhale 2 puffs every 4 (four) hours as needed into the lungs for wheezing or shortness of breath. 02/10/17 07/27/20  Hayden Rasmussen, NP  omeprazole (PRILOSEC) 20 MG capsule Take 1 capsule (20 mg total) by mouth daily. 05/27/17 07/27/20  Wallis Bamberg, PA-C    Family History History reviewed. No pertinent family history.  Social History Social History   Tobacco Use   Smoking status: Never   Smokeless tobacco: Never  Vaping Use   Vaping status: Never Used  Substance Use Topics   Alcohol use: No   Drug use: No     Allergies   Patient has no known allergies.   Review of Systems Review of Systems  Constitutional:  Positive for fatigue. Negative for chills, fever and unexpected weight change.  HENT:  Positive for sore throat. Negative for congestion, ear pain, nosebleeds, postnasal drip, rhinorrhea,  sinus pressure, sinus pain, trouble swallowing and voice change.   Respiratory:  Negative for cough, shortness of breath and wheezing.   Cardiovascular:  Negative for chest pain, palpitations and leg swelling.  Gastrointestinal:  Negative for abdominal pain, diarrhea, nausea and vomiting.  Endocrine: Negative for cold intolerance, heat intolerance, polydipsia, polyphagia and polyuria.  Musculoskeletal:  Negative for arthralgias, gait problem and myalgias.  Skin:  Negative for rash.  Neurological:  Negative for light-headedness and headaches.  Hematological:  Negative for adenopathy. Does not bruise/bleed easily.  Psychiatric/Behavioral:  Negative for confusion and sleep disturbance.      Physical Exam Triage Vital Signs ED Triage Vitals  Encounter Vitals Group     BP 12/15/22 0947 (!) 155/87     Systolic BP Percentile --      Diastolic BP Percentile --      Pulse Rate 12/15/22 0947 92     Resp 12/15/22 0947 16     Temp 12/15/22 0947 98 F (36.7 C)     Temp Source 12/15/22 0947 Oral     SpO2 12/15/22 0947 98 %     Weight --      Height --      Head Circumference --      Peak Flow --      Pain Score 12/15/22 0952 10     Pain  Loc --      Pain Education --      Exclude from Growth Chart --    No data found.  Updated Vital Signs BP (!) 155/87 (BP Location: Right Arm)   Pulse 92   Temp 98 F (36.7 C) (Oral)   Resp 16   SpO2 98%   Visual Acuity Right Eye Distance:   Left Eye Distance:   Bilateral Distance:    Right Eye Near:   Left Eye Near:    Bilateral Near:     Physical Exam Vitals and nursing note reviewed.  Constitutional:      General: She is not in acute distress.    Appearance: Normal appearance. She is not ill-appearing.  HENT:     Head: Normocephalic and atraumatic.     Right Ear: Tympanic membrane and ear canal normal.     Left Ear: Tympanic membrane and ear canal normal.     Nose: No congestion or rhinorrhea.     Mouth/Throat:     Pharynx: Uvula  midline. No pharyngeal swelling, oropharyngeal exudate or posterior oropharyngeal erythema.     Tonsils: No tonsillar exudate or tonsillar abscesses.  Eyes:     General: No scleral icterus.    Extraocular Movements: Extraocular movements intact.     Conjunctiva/sclera: Conjunctivae normal.  Cardiovascular:     Rate and Rhythm: Normal rate and regular rhythm.     Heart sounds: No murmur heard. Pulmonary:     Effort: Pulmonary effort is normal. No respiratory distress.     Breath sounds: Normal breath sounds. No wheezing or rales.  Musculoskeletal:     Cervical back: Normal range of motion. No rigidity.  Lymphadenopathy:     Cervical: No cervical adenopathy.  Skin:    Coloration: Skin is not jaundiced.     Findings: No rash.  Neurological:     General: No focal deficit present.     Mental Status: She is alert and oriented to person, place, and time.     Motor: No weakness.     Gait: Gait normal.  Psychiatric:        Mood and Affect: Mood normal.        Behavior: Behavior normal.      UC Treatments / Results  Labs (all labs ordered are listed, but only abnormal results are displayed) Labs Reviewed  COMPREHENSIVE METABOLIC PANEL  CBC  TSH  POCT RAPID STREP A (OFFICE)    EKG   Radiology No results found.  Procedures Procedures (including critical care time)  Medications Ordered in UC Medications - No data to display  Initial Impression / Assessment and Plan / UC Course  I have reviewed the triage vital signs and the nursing notes.  Pertinent labs & imaging results that were available during my care of the patient were reviewed by me and considered in my medical decision making (see chart for details).     Lab results available via mychart Follow up with PCP Final Clinical Impressions(s) / UC Diagnoses   Final diagnoses:  Fatigue, unspecified type  Sore throat     Discharge Instructions      Lab results available via mychart in 1 - 5 days Follow up  with PCP   ED Prescriptions   None    PDMP not reviewed this encounter.   Evern Core, PA-C 12/15/22 1024

## 2022-12-16 LAB — CBC
Hematocrit: 43.4 % (ref 34.0–46.6)
Hemoglobin: 14.2 g/dL (ref 11.1–15.9)
MCH: 29.1 pg (ref 26.6–33.0)
MCHC: 32.7 g/dL (ref 31.5–35.7)
MCV: 89 fL (ref 79–97)
Platelets: 270 10*3/uL (ref 150–450)
RBC: 4.88 x10E6/uL (ref 3.77–5.28)
RDW: 12.2 % (ref 11.7–15.4)
WBC: 5.8 10*3/uL (ref 3.4–10.8)

## 2022-12-16 LAB — COMPREHENSIVE METABOLIC PANEL
ALT: 17 IU/L (ref 0–32)
AST: 18 IU/L (ref 0–40)
Albumin: 4.4 g/dL (ref 3.8–4.9)
Alkaline Phosphatase: 85 IU/L (ref 44–121)
BUN/Creatinine Ratio: 11 (ref 9–23)
BUN: 7 mg/dL (ref 6–24)
Bilirubin Total: <0.2 mg/dL (ref 0.0–1.2)
CO2: 20 mmol/L (ref 20–29)
Calcium: 9.6 mg/dL (ref 8.7–10.2)
Chloride: 104 mmol/L (ref 96–106)
Creatinine, Ser: 0.64 mg/dL (ref 0.57–1.00)
Globulin, Total: 3.1 g/dL (ref 1.5–4.5)
Glucose: 112 mg/dL — ABNORMAL HIGH (ref 70–99)
Potassium: 4.1 mmol/L (ref 3.5–5.2)
Sodium: 139 mmol/L (ref 134–144)
Total Protein: 7.5 g/dL (ref 6.0–8.5)
eGFR: 107 mL/min/{1.73_m2} (ref >59)

## 2022-12-16 LAB — TSH: TSH: 1.7 u[IU]/mL (ref 0.450–4.500)

## 2023-03-01 ENCOUNTER — Ambulatory Visit
Admission: RE | Admit: 2023-03-01 | Discharge: 2023-03-01 | Disposition: A | Discharge status: Home / Self Care | Payer: Federal, State, Local not specified - PPO | Source: Ambulatory Visit | Attending: Internal Medicine | Admitting: Internal Medicine

## 2023-03-01 VITALS — BP 135/76 | HR 84 | Temp 97.5°F | Resp 17

## 2023-03-01 DIAGNOSIS — R5383 Other fatigue: Secondary | ICD-10-CM | POA: Diagnosis not present

## 2023-03-01 LAB — POCT FASTING CBG KUC MANUAL ENTRY: POCT Glucose (KUC): 117 mg/dL — AB (ref 70–99)

## 2023-03-01 NOTE — Discharge Instructions (Addendum)
The clinic will contact you with results of the testing done today if abnormal.  Please note any abnormal results regarding your thyroid function or your blood count will need to go through a PCP.  Please establish with a PCP as soon as possible for further workup and treatment of your fatigue.  Stay hydrated and try to get 8 hours of sleep at night.  Please go to the ER if you develop any worsening symptoms.  Hope you feel better soon!

## 2023-03-01 NOTE — ED Provider Notes (Addendum)
[facility]    CSN: [id number] Arrival date & time: 03/01/23  4098      History   Chief Complaint No chief complaint on file.   HPI Jennifer Shannon is a 52 y.o. female presents for evaluation of fatigue.  Patient is accompanied by daughter who helps to interpret as patient speaks Arabic.  Patient reports a couple months of fatigue, sleep disturbances, dry throat, weight gain.  She reports she is perimenopausal and last period was 2 months ago.  She is eating and drinking normally.  No history of diabetes.  States her throat gets dry at night only.  Denies mouth breathing or OSA.  Does not really have a PCP or gynecologist.  Requesting thyroid workup.  Patient was seen in urgent care on December 15, 2022 for fatigue as well and had a normal TSH, CBC, and CMP.  Glucose was slightly elevated at 112 at that time.  No other concerns at this time.  HPI  History reviewed. No pertinent past medical history.  There are no problems to display for this patient.   History reviewed. No pertinent surgical history.  OB History   No obstetric history on file.      Home Medications    Prior to Admission medications   Medication Sig Start Date End Date Taking? Authorizing Provider  naproxen (NAPROSYN) 500 MG tablet Take 1 tablet (500 mg total) by mouth every 12 (twelve) hours as needed for moderate pain or headache. 07/27/20   Sudie Grumbling, NP  albuterol (PROVENTIL HFA;VENTOLIN HFA) 108 (90 Base) MCG/ACT inhaler Inhale 2 puffs every 4 (four) hours as needed into the lungs for wheezing or shortness of breath. 02/10/17 07/27/20  Hayden Rasmussen, NP  omeprazole (PRILOSEC) 20 MG capsule Take 1 capsule (20 mg total) by mouth daily. 05/27/17 07/27/20  Wallis Bamberg, PA-C    Family History History reviewed. No pertinent family history.  Social History Social History   Tobacco Use   Smoking status: Never   Smokeless tobacco: Never  Vaping Use   Vaping status: Never Used  Substance Use  Topics   Alcohol use: No   Drug use: No     Allergies   Patient has no known allergies.   Review of Systems Review of Systems  HENT:         Dry throat     Physical Exam Triage Vital Signs ED Triage Vitals  Encounter Vitals Group     BP 03/01/23 0953 135/76     Systolic BP Percentile --      Diastolic BP Percentile --      Pulse Rate 03/01/23 0953 84     Resp 03/01/23 0953 17     Temp 03/01/23 0953 (!) 97.5 F (36.4 C)     Temp Source 03/01/23 0953 Oral     SpO2 03/01/23 0953 95 %     Weight --      Height --      Head Circumference --      Peak Flow --      Pain Score 03/01/23 0952 0     Pain Loc --      Pain Education --      Exclude from Growth Chart --    No data found.  Updated Vital Signs BP 135/76 (BP Location: Left Arm)   Pulse 84   Temp (!) 97.5 F (36.4 C) (Oral)   Resp 17   SpO2 95%   Visual Acuity Right Eye Distance:  Left Eye Distance:   Bilateral Distance:    Right Eye Near:   Left Eye Near:    Bilateral Near:     Physical Exam Vitals and nursing note reviewed.  Constitutional:      General: She is not in acute distress.    Appearance: Normal appearance. She is not ill-appearing.  HENT:     Head: Normocephalic and atraumatic.     Nose: Nose normal.     Mouth/Throat:     Mouth: Mucous membranes are moist.     Pharynx: Oropharynx is clear. No oropharyngeal exudate.  Eyes:     Pupils: Pupils are equal, round, and reactive to light.  Neck:     Thyroid: No thyroid mass, thyromegaly or thyroid tenderness.  Cardiovascular:     Rate and Rhythm: Normal rate and regular rhythm.     Heart sounds: Normal heart sounds.  Pulmonary:     Effort: Pulmonary effort is normal.     Breath sounds: Normal breath sounds.  Skin:    General: Skin is warm and dry.  Neurological:     General: No focal deficit present.     Mental Status: She is alert and oriented to person, place, and time.  Psychiatric:        Mood and Affect: Mood normal.         Behavior: Behavior normal.      UC Treatments / Results  Labs (all labs ordered are listed, but only abnormal results are displayed) Labs Reviewed  POCT FASTING CBG [facility] MANUAL ENTRY - Abnormal; Notable for the following components:      Result Value   POCT Glucose ([facility]) 117 (*)    All other components within normal limits  CBC  TSH    EKG   Radiology No results found.  Procedures Procedures (including critical care time)  Medications Ordered in UC Medications - No data to display  Initial Impression / Assessment and Plan / UC Course  I have reviewed the triage vital signs and the nursing notes.  Pertinent labs & imaging results that were available during my care of the patient were reviewed by me and considered in my medical decision making (see chart for details).     Reviewed concerns and exam with patient and daughter.  Advised patient that she really needs a PCP or gynecologist for further workup.  Discussed likely some symptoms attributed to perimenopause.  Will do TSH and CBC and advise if any abnormal results will need to be addressed by PCP verbalized understanding.  She declines assistance setting up with PCP today and states she will do so when she gets home.  Blood sugar 2 hours postprandial was 117 slightly elevated.  Discussed sleep hygiene hydration etc.  ER precautions reviewed. Final Clinical Impressions(s) / UC Diagnoses   Final diagnoses:  Other fatigue     Discharge Instructions      The clinic will contact you with results of the testing done today if abnormal.  Please note any abnormal results regarding your thyroid function or your blood count will need to go through a PCP.  Please establish with a PCP as soon as possible for further workup and treatment of your fatigue.  Stay hydrated and try to get 8 hours of sleep at night.  Please go to the ER if you develop any worsening symptoms.  Hope you feel better soon!     ED Prescriptions    None    PDMP not reviewed this encounter.  Radford Pax, NP 03/01/23 1041    Radford Pax, NP 03/01/23 1041

## 2023-03-01 NOTE — ED Triage Notes (Signed)
Pt presents with c/o dry throat when sleeping. Pt states she has thyroid concerns. Pt is requesting blood test.

## 2023-03-02 LAB — CBC
Hematocrit: 43.8 % (ref 34.0–46.6)
Hemoglobin: 14.5 g/dL (ref 11.1–15.9)
MCH: 29.2 pg (ref 26.6–33.0)
MCHC: 33.1 g/dL (ref 31.5–35.7)
MCV: 88 fL (ref 79–97)
Platelets: 267 10*3/uL (ref 150–450)
RBC: 4.96 x10E6/uL (ref 3.77–5.28)
RDW: 12 % (ref 11.7–15.4)
WBC: 5.6 10*3/uL (ref 3.4–10.8)

## 2023-03-02 LAB — TSH: TSH: 2.12 u[IU]/mL (ref 0.450–4.500)

## 2023-04-22 IMAGING — DX DG KNEE COMPLETE 4+V*L*
4 series · 4 of 4 positions shown · non-contrast
Comparison: None.

CLINICAL DATA: Pain following fall

EXAM:
LEFT KNEE - COMPLETE 4+ VIEW

[knee ap]
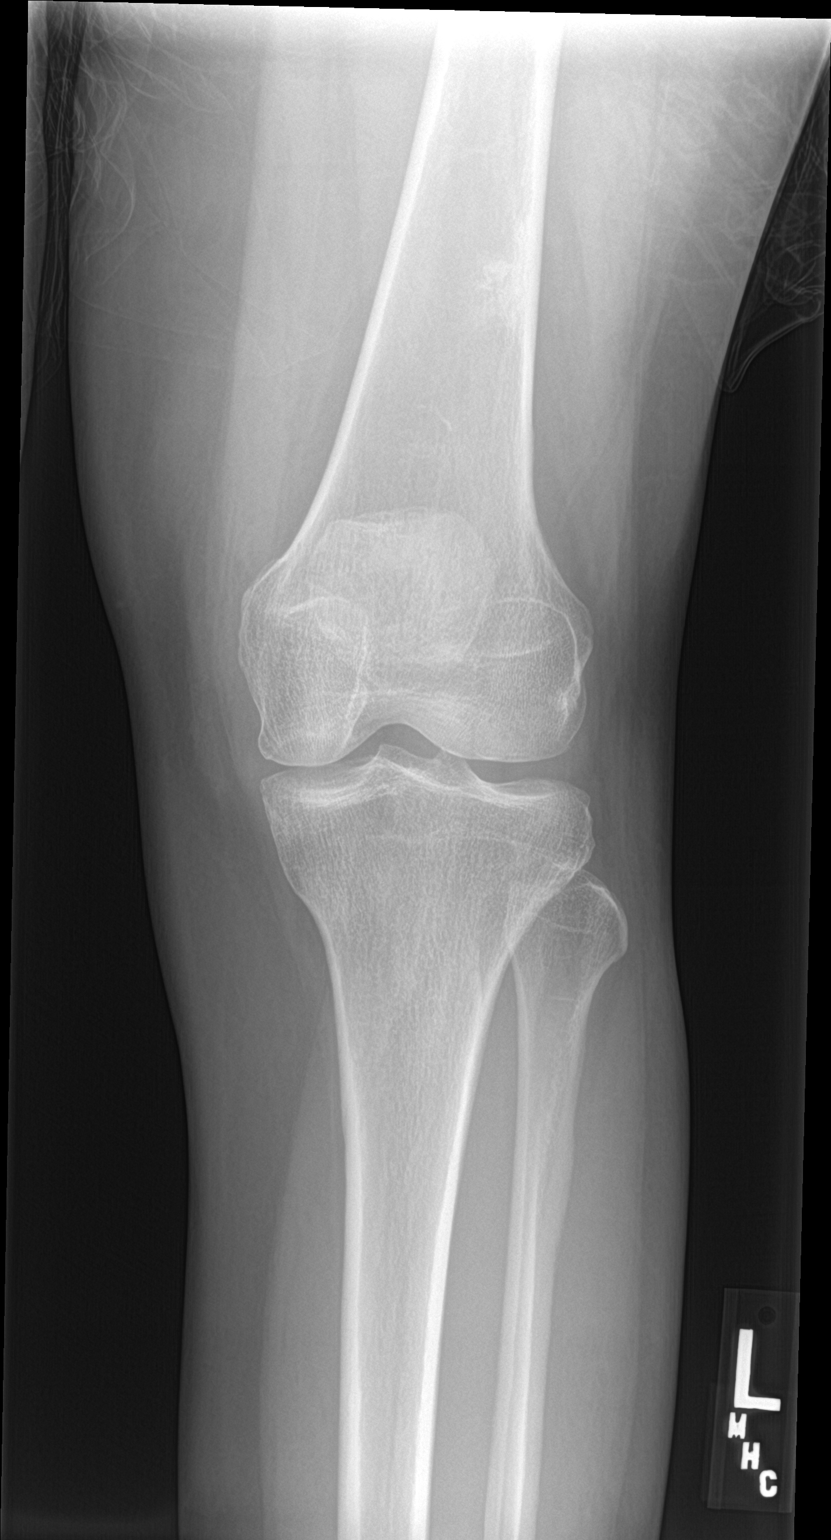

[knee obl (1 of 2)]
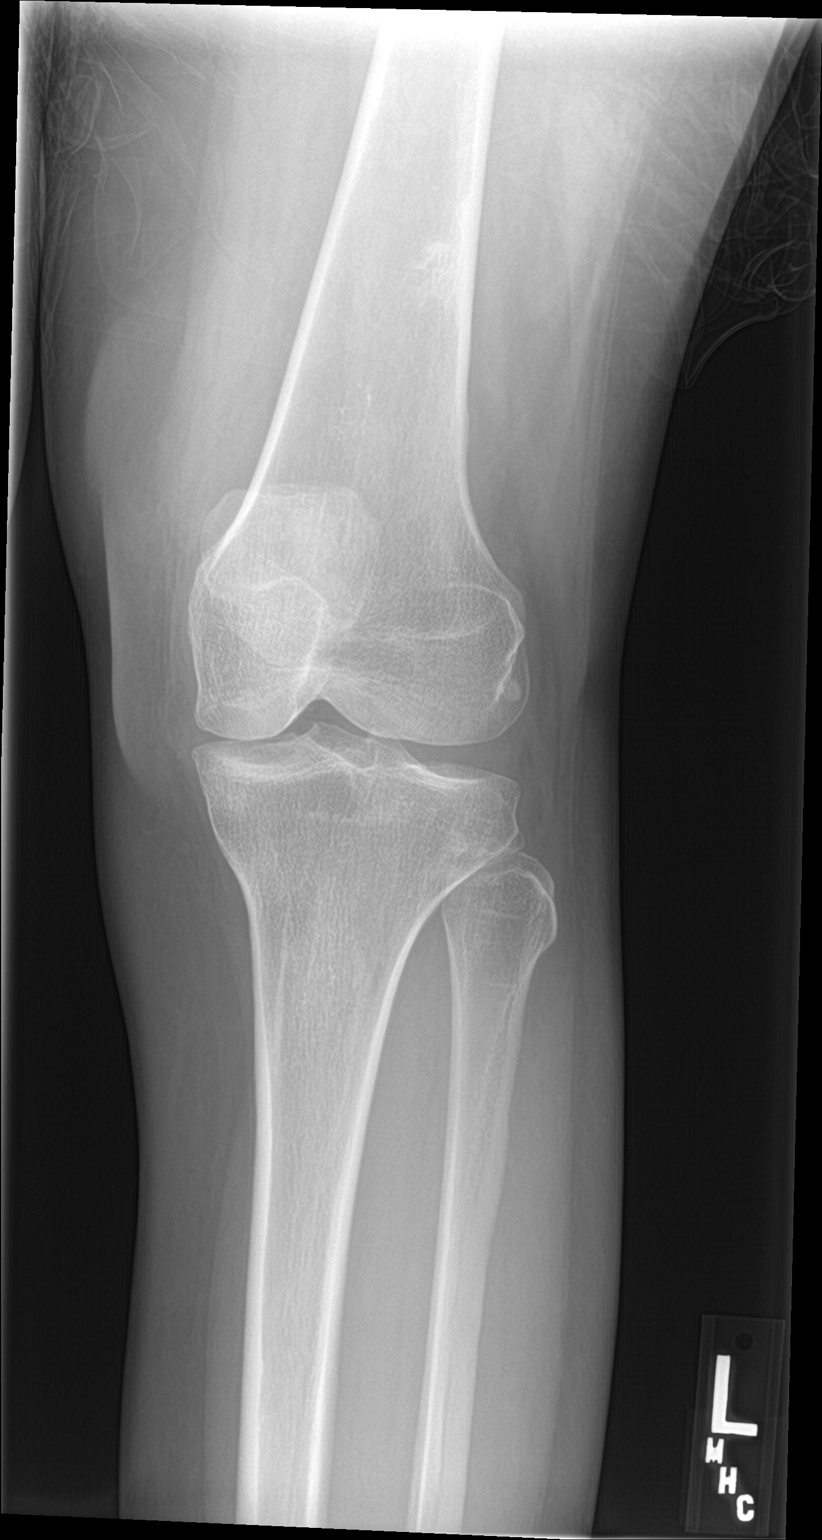

[knee obl (2 of 2)]
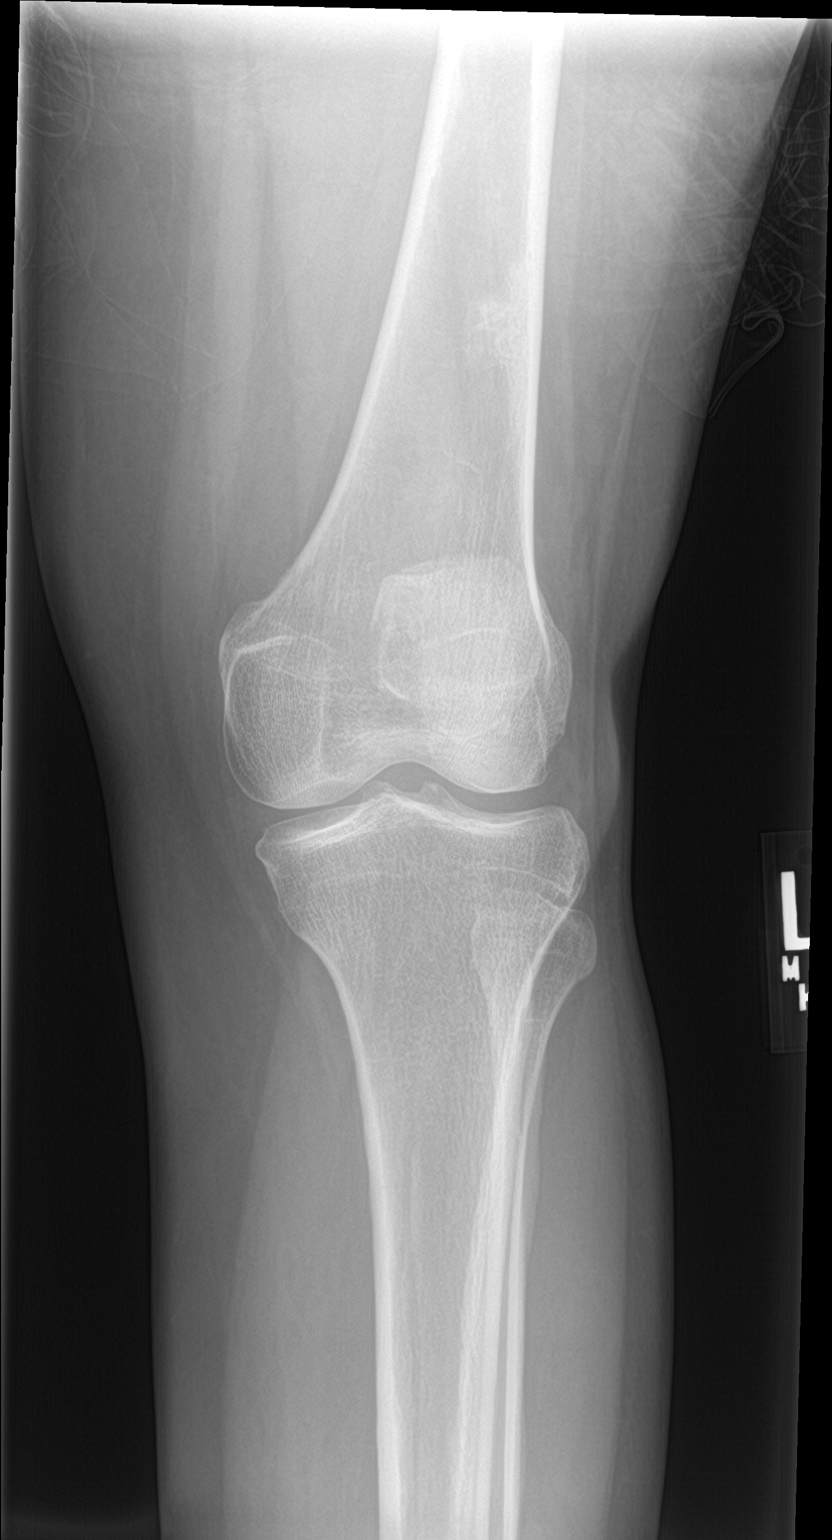

[knee lat]
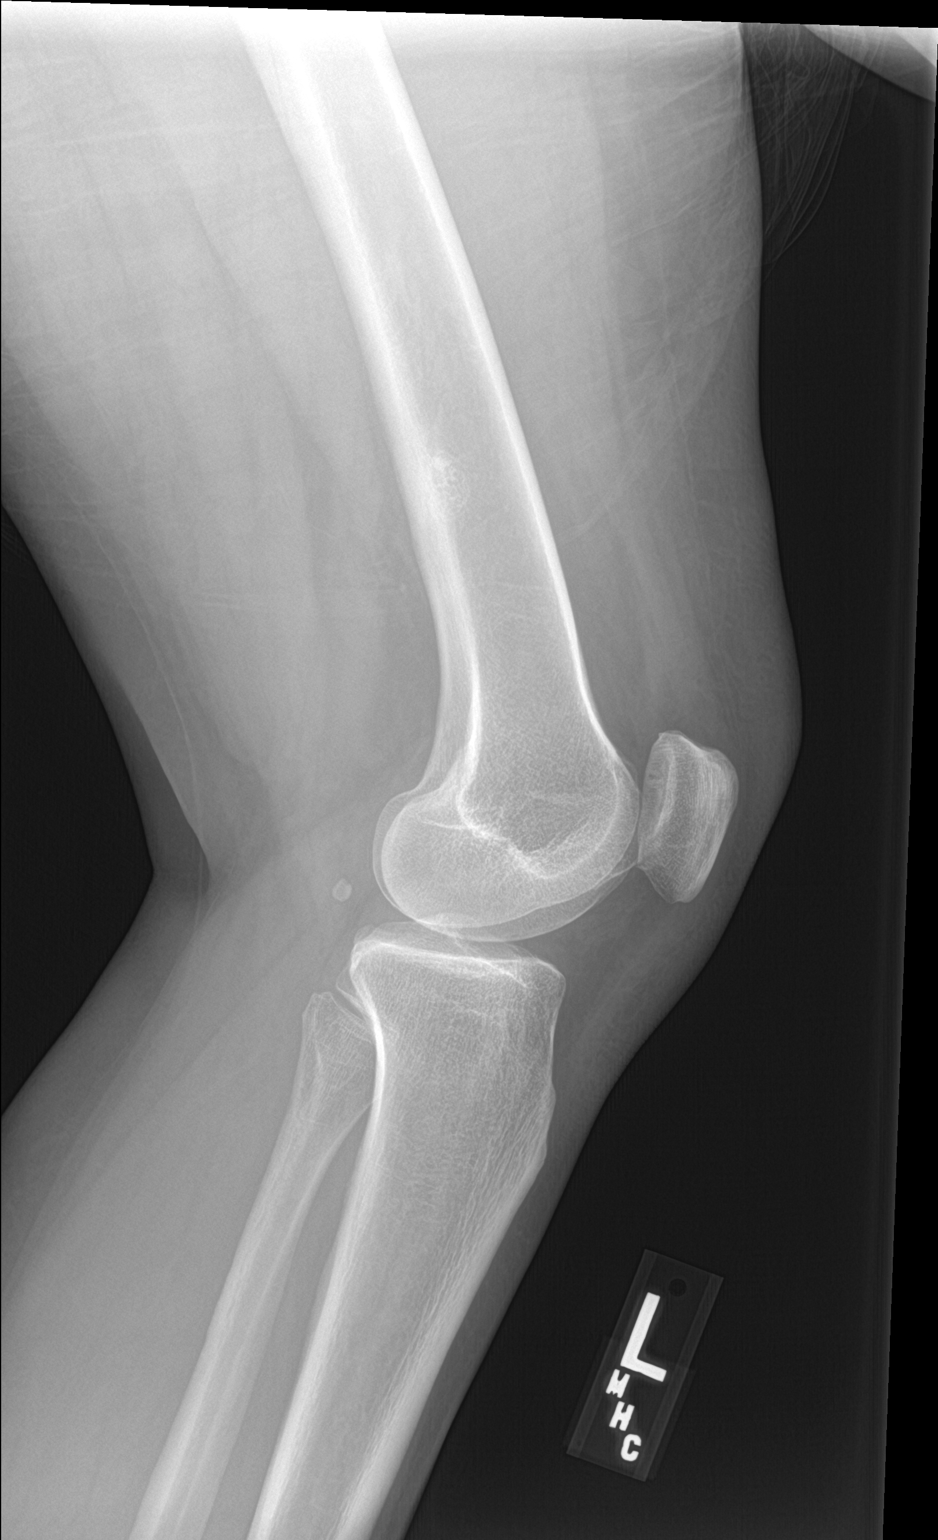

[4 of 4 positions shown; findings below may reference images not displayed]

FINDINGS: Frontal, lateral, and bilateral oblique views obtained. No fracture
or dislocation. No joint effusion. Joint spaces appear unremarkable.
No erosive change.

Benign-appearing sclerotic focus noted in the lateral distal femur
measuring 1.6 x 1.6 cm.
IMPRESSION: Benign-appearing sclerotic focus in the lateral distal femur
measuring 1.6 x 1.6 cm. It may be prudent to consider follow-up
study to assess for stability in approximately 3 months.

No fracture, dislocation, or joint effusion. No appreciable
arthropathic change.

These results will be called to the ordering clinician or
representative by the Radiologist Assistant, and communication
documented in the PACS or [REDACTED].
